# Patient Record
Sex: Male | Born: 1990 | Race: White | Hispanic: No | Marital: Married | Smoking: Former smoker
Health system: Southern US, Community
[De-identification: ages and names within clinical notes are randomized; demographics above are authoritative.]

## PROBLEM LIST (undated history)

## (undated) DIAGNOSIS — Z973 Presence of spectacles and contact lenses: Secondary | ICD-10-CM

## (undated) DIAGNOSIS — K219 Gastro-esophageal reflux disease without esophagitis: Secondary | ICD-10-CM

## (undated) DIAGNOSIS — K5792 Diverticulitis of intestine, part unspecified, without perforation or abscess without bleeding: Secondary | ICD-10-CM

## (undated) DIAGNOSIS — F988 Other specified behavioral and emotional disorders with onset usually occurring in childhood and adolescence: Secondary | ICD-10-CM

## (undated) DIAGNOSIS — I10 Essential (primary) hypertension: Secondary | ICD-10-CM

## (undated) DIAGNOSIS — J45909 Unspecified asthma, uncomplicated: Secondary | ICD-10-CM

## (undated) HISTORY — PX: ROTATOR CUFF REPAIR: SHX139

## (undated) HISTORY — DX: Other specified behavioral and emotional disorders with onset usually occurring in childhood and adolescence: F98.8

## (undated) HISTORY — PX: TONSILLECTOMY: SUR1361

## (undated) HISTORY — DX: Essential (primary) hypertension: I10

## (undated) HISTORY — DX: Presence of spectacles and contact lenses: Z97.3

## (undated) HISTORY — PX: VASECTOMY: SHX75

## (undated) HISTORY — DX: Unspecified asthma, uncomplicated: J45.909

---
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
## 2010-03-02 ENCOUNTER — Ambulatory Visit (HOSPITAL_COMMUNITY): Admission: RE | Admit: 2010-03-02 | Discharge: 2010-03-02 | Payer: Self-pay | Admitting: Pediatrics

## 2010-09-19 LAB — URINALYSIS, ROUTINE W REFLEX MICROSCOPIC
Bilirubin Urine: NEGATIVE
Glucose, UA: NEGATIVE mg/dL
Hgb urine dipstick: NEGATIVE
Nitrite: NEGATIVE
Specific Gravity, Urine: 1.01 (ref 1.005–1.030)
pH: 7 (ref 5.0–8.0)

## 2010-10-21 NOTE — Procedures (Signed)
NAMEJERICO, Brett Gonzalez             ACCOUNT NO.:  [id number]   MEDICAL RECORD NO.:  [id number]          PATIENT TYPE:  OUT   LOCATION:  RAD                           FACILITY:  [facility]   PHYSICIAN:  Edward L. Juanetta Gosling, M.D.DATE OF BIRTH:  Oct 30, 1990   DATE OF PROCEDURE:  DATE OF DISCHARGE:  06/08/2008                            PULMONARY FUNCTION TEST   Spirometry is normal and there is no evidence of airflow obstruction.      Edward L. Juanetta Gosling, M.D.  Electronically Signed     [facility]/MEDQ  D:  06/12/2008  T:  06/12/2008  Job:  [id number]   cc:   Jeoffrey Massed, MD  Fax: [phone]

## 2010-11-05 IMAGING — CR DG KNEE COMPLETE 4+V*R*
4 series · 4 of 4 positions shown · non-contrast
Comparison: None

CLINICAL DATA: Trauma, contusion right knee, question fracture

RIGHT KNEE - COMPLETE 4+ VIEW

[view not recorded (1 of 4)]
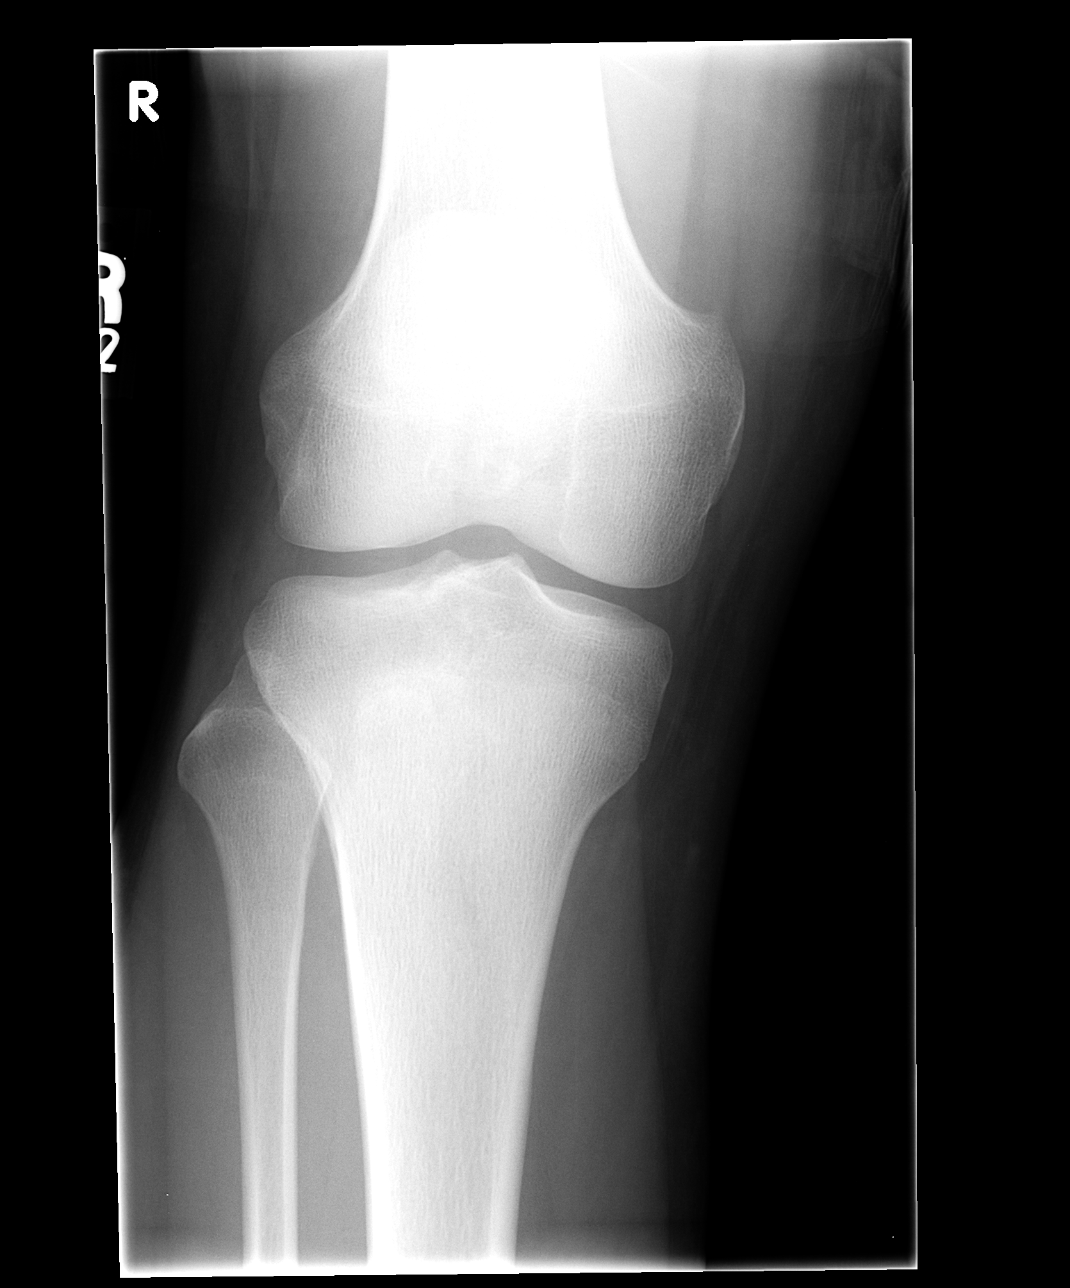

[view not recorded (2 of 4)]
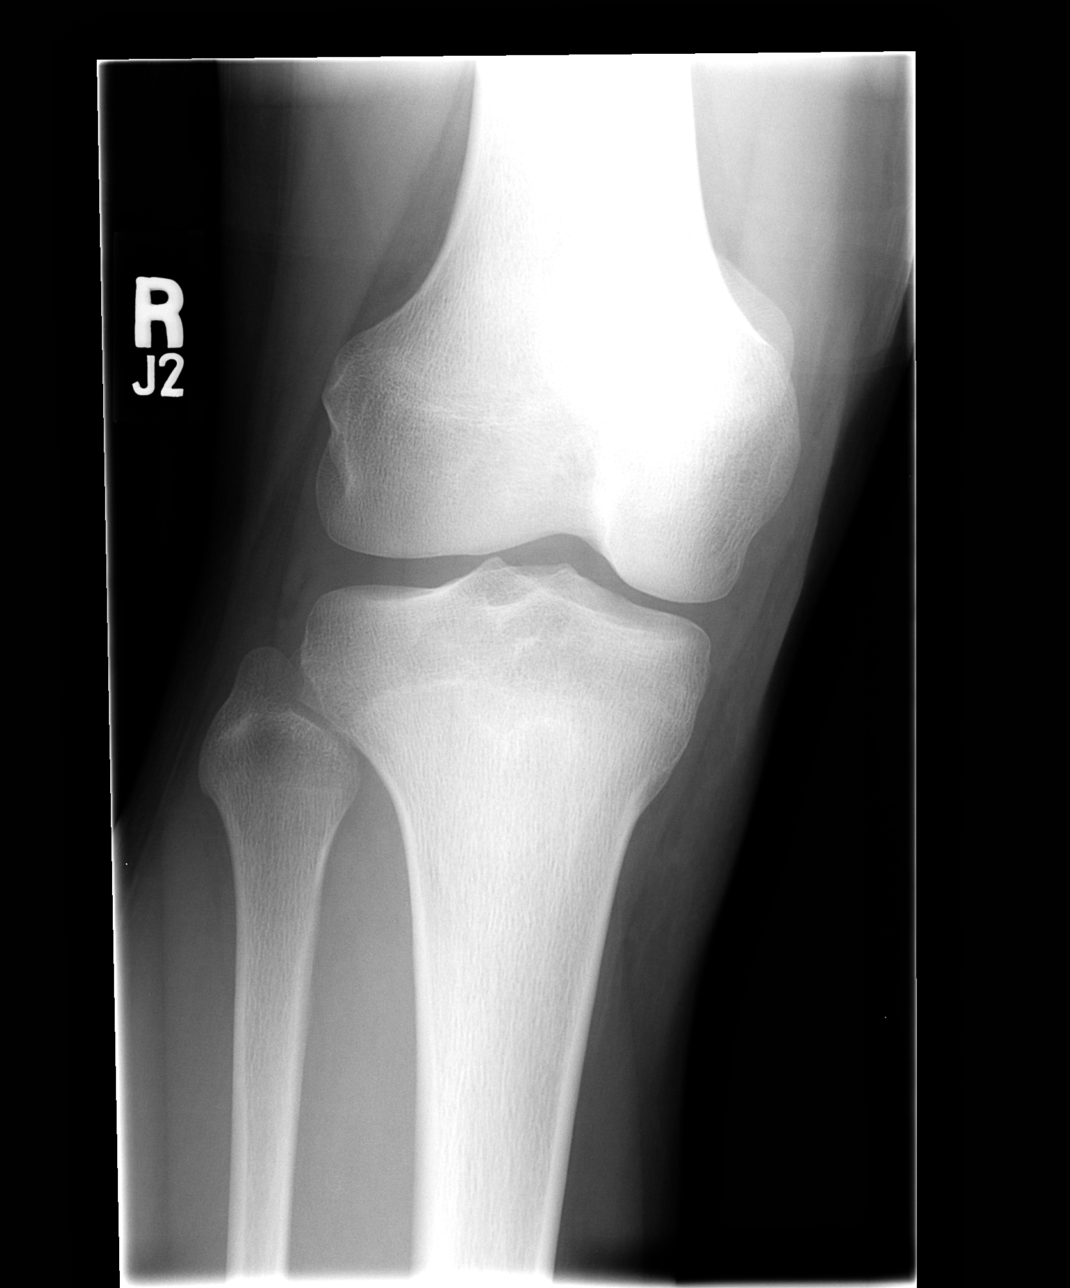

[view not recorded (3 of 4)]
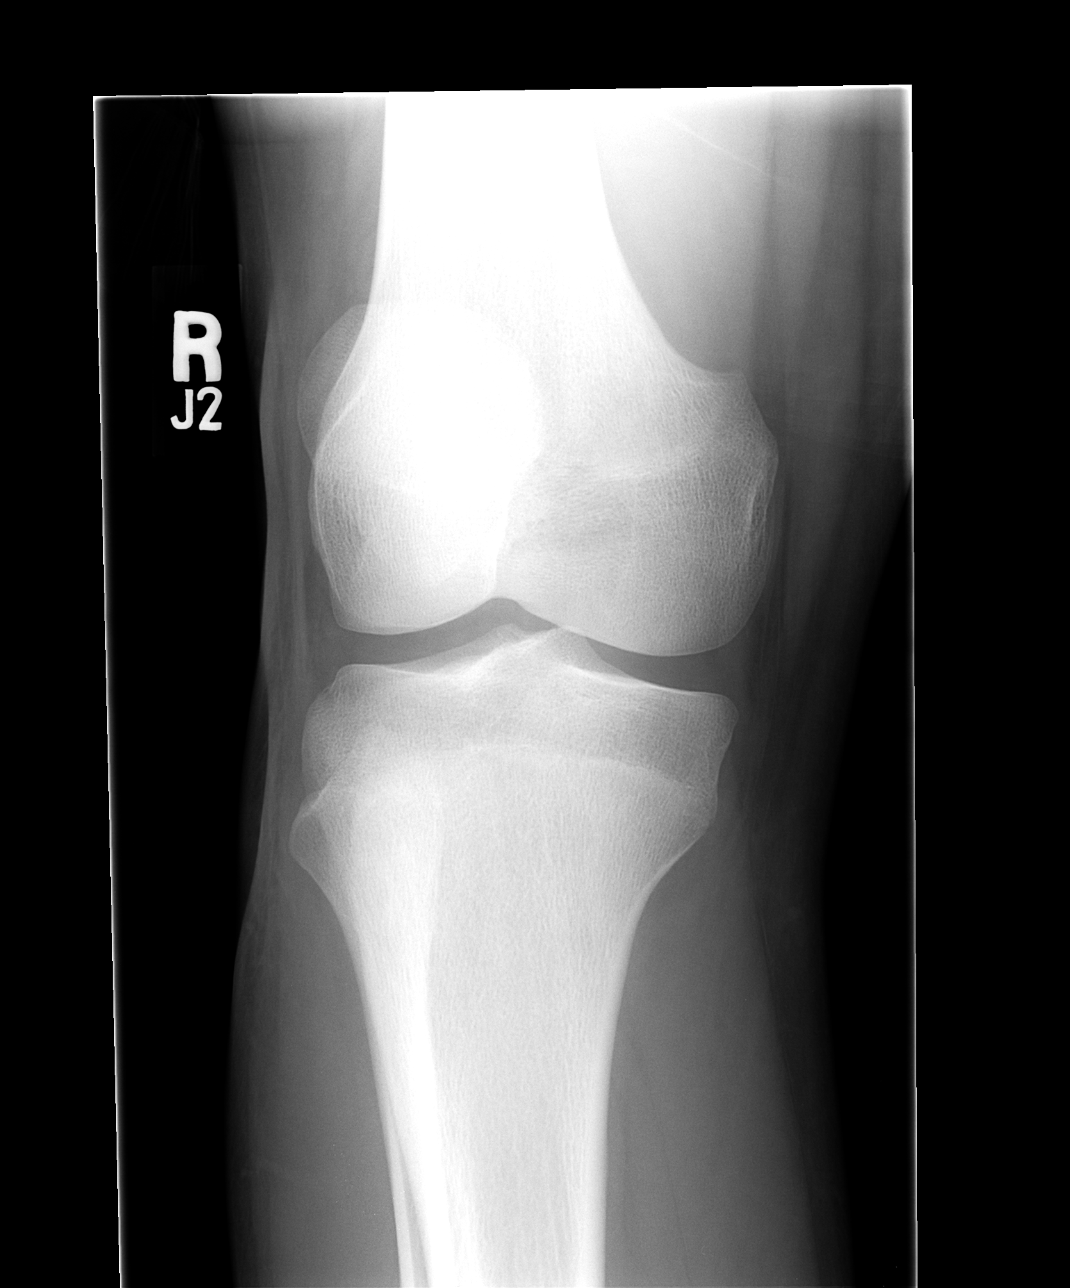

[view not recorded (4 of 4)]
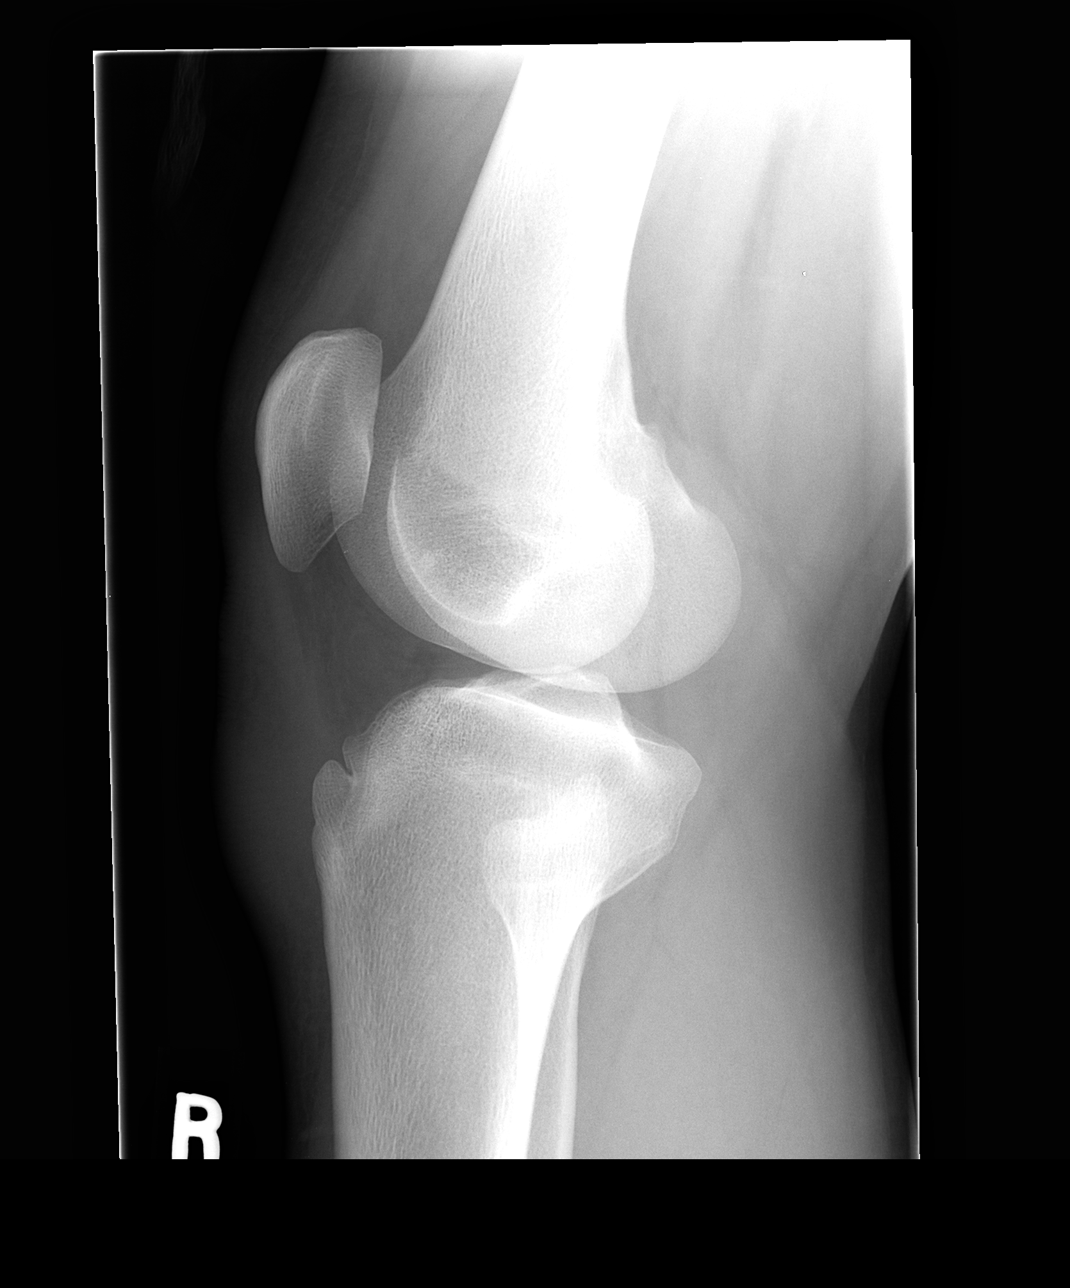

[4 of 4 positions shown; findings below may reference images not displayed]

FINDINGS: Bone mineralization normal.
Joint spaces preserved.
No fracture, dislocation, or bone destruction.
No joint effusion.
Soft tissue swelling identified anterior to the tibial tubercle and
distal patellar tendon.
IMPRESSION: Anterior soft tissue swelling.
No acute osseous abnormalities.

## 2012-01-23 ENCOUNTER — Ambulatory Visit: Payer: Self-pay | Admitting: Urology

## 2012-12-27 ENCOUNTER — Encounter (INDEPENDENT_AMBULATORY_CARE_PROVIDER_SITE_OTHER): Payer: BC Managed Care – PPO | Admitting: Urology

## 2012-12-27 DIAGNOSIS — Z302 Encounter for sterilization: Secondary | ICD-10-CM

## 2014-04-10 ENCOUNTER — Other Ambulatory Visit: Payer: Self-pay | Admitting: Orthopedic Surgery

## 2014-04-10 NOTE — Progress Notes (Signed)
I was unable to reach patient by phone.  I left  A message on voice mail.  I instructed the patient to arrive at [facility] at --  1415 , nothing to eat or drink after midnight.   I instructed the patient to not take any medications tomorrow because we do have have a list of current medications.  I asked patient to not wear any lotions, powders, cologne, jewelry, piercing, make-up or nail polish.  I asked the patient to call [phone] the am if there were any questions or problems.

## 2014-04-11 ENCOUNTER — Ambulatory Visit (HOSPITAL_COMMUNITY): Payer: BC Managed Care – PPO | Admitting: Anesthesiology

## 2014-04-11 ENCOUNTER — Ambulatory Visit (HOSPITAL_COMMUNITY)
Admission: RE | Admit: 2014-04-11 | Discharge: 2014-04-11 | Disposition: A | Discharge status: Home / Self Care | Payer: BC Managed Care – PPO | Source: Ambulatory Visit | Attending: Orthopedic Surgery | Admitting: Orthopedic Surgery

## 2014-04-11 ENCOUNTER — Encounter (HOSPITAL_COMMUNITY)
Admission: RE | Disposition: A | Discharge status: Home / Self Care | Payer: Self-pay | Source: Ambulatory Visit | Attending: Orthopedic Surgery

## 2014-04-11 ENCOUNTER — Encounter (HOSPITAL_COMMUNITY): Payer: Self-pay | Admitting: *Deleted

## 2014-04-11 DIAGNOSIS — K219 Gastro-esophageal reflux disease without esophagitis: Secondary | ICD-10-CM | POA: Insufficient documentation

## 2014-04-11 DIAGNOSIS — X58XXXA Exposure to other specified factors, initial encounter: Secondary | ICD-10-CM | POA: Diagnosis not present

## 2014-04-11 DIAGNOSIS — Z87891 Personal history of nicotine dependence: Secondary | ICD-10-CM | POA: Diagnosis not present

## 2014-04-11 DIAGNOSIS — Z885 Allergy status to narcotic agent status: Secondary | ICD-10-CM | POA: Insufficient documentation

## 2014-04-11 DIAGNOSIS — S66322A Laceration of extensor muscle, fascia and tendon of right middle finger at wrist and hand level, initial encounter: Secondary | ICD-10-CM | POA: Diagnosis not present

## 2014-04-11 HISTORY — DX: Gastro-esophageal reflux disease without esophagitis: K21.9

## 2014-04-11 HISTORY — PX: REPAIR EXTENSOR TENDON: SHX5382

## 2014-04-11 HISTORY — PX: I & D EXTREMITY: SHX5045

## 2014-04-11 SURGERY — IRRIGATION AND DEBRIDEMENT EXTREMITY
Anesthesia: General | Site: Finger | Laterality: Right

## 2014-04-11 MED ORDER — PROMETHAZINE HCL 25 MG/ML IJ SOLN: 6.2500 mg | INTRAMUSCULAR | Status: DC | PRN

## 2014-04-11 MED ORDER — CEFAZOLIN SODIUM-DEXTROSE 2-3 GM-% IV SOLR
2.0000 g | INTRAVENOUS | Status: AC
  Administered 2014-04-11: 2 g via INTRAVENOUS

## 2014-04-11 MED ORDER — MIDAZOLAM HCL 2 MG/2ML IJ SOLN
INTRAMUSCULAR | Status: DC | PRN
  Administered 2014-04-11: 2 mg via INTRAVENOUS

## 2014-04-11 MED ORDER — CHLORHEXIDINE GLUCONATE 4 % EX LIQD
60.0000 mL | Freq: Once | CUTANEOUS | Status: DC
  Filled 2014-04-11: qty 60

## 2014-04-11 MED ORDER — MIDAZOLAM HCL 2 MG/2ML IJ SOLN
INTRAMUSCULAR | Status: AC
  Filled 2014-04-11: qty 2

## 2014-04-11 MED ORDER — LACTATED RINGERS IV SOLN
INTRAVENOUS | Status: DC | PRN
  Administered 2014-04-11 (×2): via INTRAVENOUS

## 2014-04-11 MED ORDER — FENTANYL CITRATE 0.05 MG/ML IJ SOLN
INTRAMUSCULAR | Status: AC
  Filled 2014-04-11: qty 5

## 2014-04-11 MED ORDER — BUPIVACAINE HCL (PF) 0.25 % IJ SOLN
INTRAMUSCULAR | Status: AC
  Filled 2014-04-11: qty 30

## 2014-04-11 MED ORDER — PROPOFOL 10 MG/ML IV BOLUS
INTRAVENOUS | Status: DC | PRN
  Administered 2014-04-11: 200 mg via INTRAVENOUS

## 2014-04-11 MED ORDER — 0.9 % SODIUM CHLORIDE (POUR BTL) OPTIME
TOPICAL | Status: DC | PRN
  Administered 2014-04-11: 1000 mL

## 2014-04-11 MED ORDER — BUPIVACAINE HCL (PF) 0.25 % IJ SOLN
INTRAMUSCULAR | Status: DC | PRN
  Administered 2014-04-11: 10 mL

## 2014-04-11 MED ORDER — SODIUM CHLORIDE 0.45 % IV SOLN: INTRAVENOUS | Status: DC

## 2014-04-11 MED ORDER — LIDOCAINE HCL (CARDIAC) 20 MG/ML IV SOLN
INTRAVENOUS | Status: DC | PRN
  Administered 2014-04-11: 100 mg via INTRAVENOUS

## 2014-04-11 MED ORDER — ONDANSETRON HCL 4 MG/2ML IJ SOLN
INTRAMUSCULAR | Status: DC | PRN
  Administered 2014-04-11: 4 mg via INTRAVENOUS

## 2014-04-11 MED ORDER — HYDROMORPHONE HCL 1 MG/ML IJ SOLN: 0.2500 mg | INTRAMUSCULAR | Status: DC | PRN

## 2014-04-11 MED ORDER — SODIUM CHLORIDE 0.9 % IR SOLN
Status: DC | PRN
  Administered 2014-04-11: 3000 mL

## 2014-04-11 MED ORDER — ONDANSETRON HCL 4 MG/2ML IJ SOLN
INTRAMUSCULAR | Status: AC
  Filled 2014-04-11: qty 2

## 2014-04-11 MED ORDER — SCOPOLAMINE 1 MG/3DAYS TD PT72
MEDICATED_PATCH | TRANSDERMAL | Status: AC
  Administered 2014-04-11: 1 via TRANSDERMAL
  Filled 2014-04-11: qty 1

## 2014-04-11 MED ORDER — CEFAZOLIN SODIUM-DEXTROSE 2-3 GM-% IV SOLR
INTRAVENOUS | Status: AC
  Filled 2014-04-11: qty 50

## 2014-04-11 MED ORDER — DEXAMETHASONE SODIUM PHOSPHATE 4 MG/ML IJ SOLN
INTRAMUSCULAR | Status: DC | PRN
  Administered 2014-04-11: 4 mg via INTRAVENOUS

## 2014-04-11 MED ORDER — FENTANYL CITRATE 0.05 MG/ML IJ SOLN
INTRAMUSCULAR | Status: DC | PRN
  Administered 2014-04-11 (×6): 50 ug via INTRAVENOUS

## 2014-04-11 SURGICAL SUPPLY — 63 items
BANDAGE ELASTIC 3 VELCRO ST LF (GAUZE/BANDAGES/DRESSINGS) ×2 IMPLANT
BANDAGE ELASTIC 4 VELCRO ST LF (GAUZE/BANDAGES/DRESSINGS) ×3 IMPLANT
BNDG COHESIVE 1X5 TAN STRL LF (GAUZE/BANDAGES/DRESSINGS) IMPLANT
BNDG CONFORM 2 STRL LF (GAUZE/BANDAGES/DRESSINGS) IMPLANT
BNDG GAUZE ELAST 4 BULKY (GAUZE/BANDAGES/DRESSINGS) ×5 IMPLANT
CORDS BIPOLAR (ELECTRODE) ×3 IMPLANT
COVER SURGICAL LIGHT HANDLE (MISCELLANEOUS) ×3 IMPLANT
CUFF TOURNIQUET SINGLE 18IN (TOURNIQUET CUFF) ×3 IMPLANT
CUFF TOURNIQUET SINGLE 24IN (TOURNIQUET CUFF) IMPLANT
DECANTER SPIKE VIAL GLASS SM (MISCELLANEOUS) ×1 IMPLANT
DRAPE SURG 17X23 STRL (DRAPES) ×3 IMPLANT
DRSG ADAPTIC 3X8 NADH LF (GAUZE/BANDAGES/DRESSINGS) ×1 IMPLANT
DRSG EMULSION OIL 3X3 NADH (GAUZE/BANDAGES/DRESSINGS) ×2 IMPLANT
ELECT REM PT RETURN 9FT ADLT (ELECTROSURGICAL)
ELECTRODE REM PT RTRN 9FT ADLT (ELECTROSURGICAL) IMPLANT
GAUZE SPONGE 2X2 8PLY STRL LF (GAUZE/BANDAGES/DRESSINGS) IMPLANT
GAUZE SPONGE 4X4 12PLY STRL (GAUZE/BANDAGES/DRESSINGS) ×3 IMPLANT
GAUZE XEROFORM 1X8 LF (GAUZE/BANDAGES/DRESSINGS) ×3 IMPLANT
GLOVE BIOGEL M STRL SZ7.5 (GLOVE) ×3 IMPLANT
GLOVE SS BIOGEL STRL SZ 8 (GLOVE) ×1 IMPLANT
GLOVE SUPERSENSE BIOGEL SZ 8 (GLOVE) ×2
GOWN STRL REUS W/ TWL LRG LVL3 (GOWN DISPOSABLE) ×3 IMPLANT
GOWN STRL REUS W/ TWL XL LVL3 (GOWN DISPOSABLE) ×3 IMPLANT
GOWN STRL REUS W/TWL LRG LVL3 (GOWN DISPOSABLE) ×3
GOWN STRL REUS W/TWL XL LVL3 (GOWN DISPOSABLE) ×6
HANDPIECE INTERPULSE COAX TIP (DISPOSABLE)
KIT BASIN OR (CUSTOM PROCEDURE TRAY) ×3 IMPLANT
KIT ROOM TURNOVER OR (KITS) ×3 IMPLANT
MANIFOLD NEPTUNE II (INSTRUMENTS) ×3 IMPLANT
NDL HYPO 25GX1X1/2 BEV (NEEDLE) IMPLANT
NEEDLE HYPO 25GX1X1/2 BEV (NEEDLE) ×3 IMPLANT
NS IRRIG 1000ML POUR BTL (IV SOLUTION) ×3 IMPLANT
PACK ORTHO EXTREMITY (CUSTOM PROCEDURE TRAY) ×3 IMPLANT
PAD ARMBOARD 7.5X6 YLW CONV (MISCELLANEOUS) ×6 IMPLANT
PAD CAST 4YDX4 CTTN HI CHSV (CAST SUPPLIES) ×1 IMPLANT
PADDING CAST COTTON 4X4 STRL (CAST SUPPLIES) ×3
PADDING CAST SYNTHETIC 2 (CAST SUPPLIES) ×4
PADDING CAST SYNTHETIC 2X4 NS (CAST SUPPLIES) IMPLANT
SET HNDPC FAN SPRY TIP SCT (DISPOSABLE) IMPLANT
SOLUTION BETADINE 4OZ (MISCELLANEOUS) ×3 IMPLANT
SPECIMEN JAR SMALL (MISCELLANEOUS) ×3 IMPLANT
SPONGE GAUZE 2X2 STER 10/PKG (GAUZE/BANDAGES/DRESSINGS)
SPONGE LAP 18X18 X RAY DECT (DISPOSABLE) ×1 IMPLANT
SPONGE LAP 4X18 X RAY DECT (DISPOSABLE) ×1 IMPLANT
SPONGE SCRUB IODOPHOR (GAUZE/BANDAGES/DRESSINGS) ×3 IMPLANT
SUCTION FRAZIER TIP 10 FR DISP (SUCTIONS) ×2 IMPLANT
SUT FIBERWIRE 3-0 18 DIAM 3/8 (SUTURE) ×3
SUT FIBERWIRE 4-0 18 TAPR NDL (SUTURE) ×3
SUT MERSILENE 4 0 P 3 (SUTURE) IMPLANT
SUT PROLENE 4 0 PS 2 18 (SUTURE) ×4 IMPLANT
SUT VIC AB 2-0 CT1 27 (SUTURE)
SUT VIC AB 2-0 CT1 TAPERPNT 27 (SUTURE) IMPLANT
SUTURE FIBERWR 3-0 18 DIAM 3/8 (SUTURE) IMPLANT
SUTURE FIBERWR 4-0 18 TAPR NDL (SUTURE) IMPLANT
SYR CONTROL 10ML LL (SYRINGE) ×2 IMPLANT
TOWEL OR 17X24 6PK STRL BLUE (TOWEL DISPOSABLE) ×3 IMPLANT
TOWEL OR 17X26 10 PK STRL BLUE (TOWEL DISPOSABLE) ×3 IMPLANT
TUBE ANAEROBIC SPECIMEN COL (MISCELLANEOUS) IMPLANT
TUBE CONNECTING 12'X1/4 (SUCTIONS) ×1
TUBE CONNECTING 12X1/4 (SUCTIONS) ×2 IMPLANT
UNDERPAD 30X30 INCONTINENT (UNDERPADS AND DIAPERS) ×3 IMPLANT
WATER STERILE IRR 1000ML POUR (IV SOLUTION) ×3 IMPLANT
YANKAUER SUCT BULB TIP NO VENT (SUCTIONS) ×3 IMPLANT

## 2014-04-11 NOTE — Op Note (Signed)
See Dictation #[id number] Amanda PeaGramig MD

## 2014-04-11 NOTE — Anesthesia Postprocedure Evaluation (Signed)
  Anesthesia Post-op Note  Patient: Brett AntonMatthew T Gonzalez  Procedure(s) Performed: Procedure(s): IRRIGATION AND DEBRIDEMENT RIGHT HAND AND MIDDLE FINGER WITH  (Right) EXTENSOR TENDON REPAIR AND RECONSTRUCTION  (Right)  Patient Location: PACU  Anesthesia Type:General  Level of Consciousness: awake and alert   Airway and Oxygen Therapy: Patient Spontanous Breathing  Post-op Pain: mild  Post-op Assessment: Post-op Vital signs reviewed  Post-op Vital Signs: stable  Last Vitals:  Filed Vitals:   04/11/14 1625  BP: 119/67  Pulse: 77  Temp:   Resp: 12    Complications: No apparent anesthesia complications

## 2014-04-11 NOTE — Transfer of Care (Signed)
Immediate Anesthesia Transfer of Care Note  Patient: Brett AntonMatthew T Glauber  Procedure(s) Performed: Procedure(s): IRRIGATION AND DEBRIDEMENT RIGHT HAND AND MIDDLE FINGER WITH  (Right) EXTENSOR TENDON REPAIR AND RECONSTRUCTION  (Right)  Patient Location: PACU  Anesthesia Type:General  Level of Consciousness: sedated  Airway & Oxygen Therapy: Patient Spontanous Breathing and Patient connected to nasal cannula oxygen  Post-op Assessment: Report given to PACU RN and Post -op Vital signs reviewed and stable  Post vital signs: Reviewed and stable  Complications: No apparent anesthesia complications

## 2014-04-11 NOTE — Anesthesia Preprocedure Evaluation (Addendum)
Anesthesia Evaluation  Patient identified by MRN, date of birth, ID band Patient awake    Reviewed: Allergy & Precautions, H&P , NPO status , Patient's Chart, lab work & pertinent test results  Airway Mallampati: III  TM Distance: >3 FB Neck ROM: Full    Dental  (+) Teeth Intact, Dental Advisory Given   Pulmonary neg pulmonary ROS, former smoker,  breath sounds clear to auscultation        Cardiovascular negative cardio ROS  Rhythm:Regular Rate:Normal     Neuro/Psych    GI/Hepatic GERD-  Medicated and Controlled,  Endo/Other  negative endocrine ROS  Renal/GU negative Renal ROS     Musculoskeletal negative musculoskeletal ROS (+)   Abdominal   Peds  Hematology negative hematology ROS (+)   Anesthesia Other Findings   Reproductive/Obstetrics                            Anesthesia Physical Anesthesia Plan  ASA: I  Anesthesia Plan: General   Post-op Pain Management:    Induction: Intravenous  Airway Management Planned:   Additional Equipment:   Intra-op Plan:   Post-operative Plan: Extubation in OR  Informed Consent: I have reviewed the patients History and Physical, chart, labs and discussed the procedure including the risks, benefits and alternatives for the proposed anesthesia with the patient or authorized representative who has indicated his/her understanding and acceptance.   Dental advisory given  Plan Discussed with: CRNA and Surgeon  Anesthesia Plan Comments:         Anesthesia Quick Evaluation

## 2014-04-11 NOTE — Op Note (Signed)
NAMAnise Gonzalez:  Hayman, Supreme             ACCOUNT NO.:  [id number]  MEDICAL RECORD NO.:  [id number]  LOCATION:  [facility]                         FACILITY:  [facility]  PHYSICIAN:  Dionne AnoWilliam M. Ellaina Schuler, M.D.DATE OF BIRTH:  02-11-1991  DATE OF PROCEDURE:  04/11/2014 DATE OF DISCHARGE:  04/11/2014                              OPERATIVE REPORT   PREOPERATIVE DIAGNOSIS:  Right middle finger laceration at the hand region with loss of EDC (extensor digitorum communis) function.  POSTOPERATIVE DIAGNOSIS:  Right middle finger laceration at the hand region with loss of EDC (extensor digitorum communis) function.  PROCEDURE: 1. I and D of skin, subcutaneous tissue, muscle, and associated soft     tissues.  This was an excisional debridement. 2. Repair of extensor digitorum communis to the middle finger at the     hand level.  This was mid metacarpal repair to distal metacarpal     region.  SURGEON:  Dionne AnoWilliam M. Amanda PeaGramig, M.D.  ASSISTANT:  Karie ChimeraBrian Buchanan, PA-C.  COMPLICATION:  None.  ANESTHESIA:  General.  TOURNIQUET TIME:  Less than 30 minutes.  INDICATIONS:  The patient is a pleasant male who presents with the above- mentioned diagnosis.  I have counseled him in regard to risks and benefits of surgery, and he desires to proceed with the above-mentioned operative intervention.  He has lost function in his middle finger due to loss of the extensor apparatus.  He desires to proceed with surgical avenues of care.  OPERATION IN DETAIL:  The patient was seen by myself and Anesthesia, taken to the operative suite, underwent smooth induction of general anesthesia, prepped and draped in usual sterile fashion.  Preoperative antibiotics were given.  Arm was elevated, tourniquet was insufflated to 250 mmHg, and time-out had previously been called I should note.  Following this, I and D of skin, subcutaneous tissue, muscle, was accomplished.  3 L of saline were placed through and through the wound after exposure  of the tendon and soft tissues.  There was no gross contamination, infection, or other problems.  At this time, we then prepared the tendon for repair. The patient had an 8-strand modified Mariane BaumgartenKessler Tajima repair with 3-0 and 4-0 FiberWire without difficulty. The tendon approximated beautifully without difficulty.  Following this, the patient then underwent irrigation, followed by wound closure.  He demonstrated an excellent tenodesis effect, and there was no tension at the repair with 75% flexion.  I have discussed with the patient these issues.  Following dressing and application, he was placed in a fiberglass cast, he will see us in 12 days.  We will mobilize him for 4 weeks and begin gentle interval range of motion, massage, and therapeutic algorithm. These notes have been discussed.  All questions have been encouraged and answered.     Dionne AnoWilliam M. Amanda PeaGramig, M.D.     [facility]  D:  04/11/2014  T:  04/11/2014  Job:  [id number]

## 2014-04-11 NOTE — Anesthesia Procedure Notes (Signed)
Procedure Name: LMA Insertion Date/Time: 04/11/2014 3:04 PM Performed by: Alanda AmassFRIEDMAN, Quandra Fedorchak A Pre-anesthesia Checklist: Patient identified, Timeout performed, Emergency Drugs available, Suction available and Patient being monitored Patient Re-evaluated:Patient Re-evaluated prior to inductionOxygen Delivery Method: Circle system utilized Preoxygenation: Pre-oxygenation with 100% oxygen Intubation Type: IV induction LMA: LMA inserted LMA Size: 5.0 Number of attempts: 1 Placement Confirmation: positive ETCO2 and breath sounds checked- equal and bilateral Tube secured with: Tape Dental Injury: Teeth and Oropharynx as per pre-operative assessment

## 2014-04-11 NOTE — H&P (Signed)
Brett Gonzalez is an 23 y.o. male.   Chief Complaint: right hand laceration with loss of extension HPI: patient presents for repair reconstruction right hand  Past Medical History  Diagnosis Date  . GERD (gastroesophageal reflux disease)   . Medical history non-contributory     Past Surgical History  Procedure Laterality Date  . Rotator cuff repair Right     History reviewed. No pertinent family history. Social History:  reports that he quit smoking about 3 months ago. He does not have any smokeless tobacco history on file. He reports that he does not drink alcohol or use illicit drugs.  Allergies:  Allergies  Allergen Reactions  . Codeine Swelling    Medications Prior to Admission  Medication Sig Dispense Refill  . omeprazole (PRILOSEC OTC) 20 MG tablet Take 20 mg by mouth daily.      No results found for this or any previous visit (from the past 48 hour(s)). No results found.  Review of Systems  Respiratory: Negative.   Cardiovascular: Negative.   Gastrointestinal: Negative.   Genitourinary: Negative.   Psychiatric/Behavioral: Negative.     Blood pressure 141/70, pulse 77, temperature 98 F (36.7 C), temperature source Oral, resp. rate 16, height 5\' 10"  (1.778 m), weight 92.987 kg (205 lb), SpO2 99 %. Physical Exam  Laceration right hand with loss of extension Patient presents for the repairs necessary. The patient is alert and oriented in no acute distress the patient complains of pain in the affected upper extremity.  The patient is noted to have a normal HEENT exam.  Lung fields show equal chest expansion and no shortness of breath  abdomen exam is nontender without distention.  Lower extremity examination does not show any fracture dislocation or blood clot symptoms.  Pelvis is stable neck and back are stable and nontender        Assessment/Plan Plan irrigation debridement and repair right hand extensor apparatus  We are planning surgery for your  upper extremity. The risk and benefits of surgery include risk of bleeding infection anesthesia damage to normal structures and failure of the surgery to accomplish its intended goals of relieving symptoms and restoring function with this in mind we'll going to proceed. I have specifically discussed with the patient the pre-and postoperative regime and the does and don'ts and risk and benefits in great detail. Risk and benefits of surgery also include risk of dystrophy chronic nerve pain failure of the healing process to go onto completion and other inherent risks of surgery The relavent the pathophysiology of the disease/injury process, as well as the alternatives for treatment and postoperative course of action has been discussed in great detail with the patient who desires to proceed.  We will do everything in our power to help you (the patient) restore function to the upper extremity. Is a pleasure to see this patient today.   Karen ChafeGRAMIG III,Jovaun Levene M 04/11/2014, 3:53 PM

## 2014-04-14 ENCOUNTER — Encounter (HOSPITAL_COMMUNITY): Payer: Self-pay | Admitting: Orthopedic Surgery

## 2016-06-28 ENCOUNTER — Ambulatory Visit (INDEPENDENT_AMBULATORY_CARE_PROVIDER_SITE_OTHER): Payer: Managed Care, Other (non HMO) | Admitting: Medical

## 2016-06-28 ENCOUNTER — Encounter: Payer: Self-pay | Admitting: Medical

## 2016-06-28 VITALS — BP 152/88 | HR 95 | Temp 99.0°F | Ht 69.0 in | Wt 237.6 lb

## 2016-06-28 DIAGNOSIS — K529 Noninfective gastroenteritis and colitis, unspecified: Secondary | ICD-10-CM

## 2016-06-28 DIAGNOSIS — R009 Unspecified abnormalities of heart beat: Secondary | ICD-10-CM | POA: Diagnosis not present

## 2016-06-28 DIAGNOSIS — R195 Other fecal abnormalities: Secondary | ICD-10-CM

## 2016-06-28 DIAGNOSIS — I10 Essential (primary) hypertension: Secondary | ICD-10-CM

## 2016-06-28 NOTE — Progress Notes (Signed)
Subjective: Chief Complaint  Patient presents with  . new patient    stomach cramping and  pain, vomitting,  acid reflux    Here as a new patient. Accompanied by wife.   Has been having diarrhea.  2 weeks ago got sick with nausea, vomiting, and diarrhea.   Lasted 2.5 days.  This resolved.  2 days ago had similar symptoms again, had irritable bowels, had several episodes of diarrhea.  13 loose stools yesterday, 2-3 loose stools today.  13 loose stools 2 days ago.   No nausea, no vomiting, no fever, no blood in stool.  Last night had more cramping in stomach, bloating, cramping.  His children had symptoms just a few days Raquel Sarna and [place]).    They were both sick last week.  Wife had similar symptoms last week.  Had some dizziness, some fatigue.   His parents had the stomach bug in their household as well recently.      No recent travel, no new animal exposure.  Have a cat at home.   Cooks food thorough, washes produce.   No recent antibiotics.  No recent visitation to hospital or nursing home.   Mom has IBS.  He has hx/o GERD.  GERD aggravated by spicy foods, [place].   Eats a lot of spicy foods.  Works in Therapist, occupational.  Stress - normal   Had physical urgent care in December, pre-employment  No labs done.   Has had elevated BPs in the past.  Past Medical History:  Diagnosis Date  . ADD (attention deficit disorder)   . Asthma    worse in childhood  . GERD (gastroesophageal reflux disease)   . Wears contact lenses    Current Outpatient Prescriptions on File Prior to Visit  Medication Sig Dispense Refill  . omeprazole (PRILOSEC OTC) 20 MG tablet Take 20 mg by mouth daily.     No current facility-administered medications on file prior to visit.     ROS as in subjective  Objective: BP (!) 152/88 (BP Location: Right Arm, Patient Position: Sitting)   Pulse 95   Temp 99 F (37.2 C) (Oral)   Ht '5\' 9"'  (1.753 m)   Wt 237 lb 9.6 oz (107.8 kg)   SpO2 97%   BMI 35.09 kg/m   General  appearance: alert, no distress, WD/WN HEENT: normocephalic, sclerae anicteric, PERRLA, EOMi, nares patent, no discharge or erythema, pharynx normal Oral cavity: MMM, no lesions Neck: supple, no lymphadenopathy, no thyromegaly, no masses Heart: RRR, normal S1, S2, no murmurs Lungs: CTA bilaterally, no wheezes, rhonchi, or rales Abdomen: +bs, soft, mild lower abdominal tenderness, non distended, no masses, no hepatomegaly, no splenomegaly Extremities: no edema, no cyanosis, no clubbing Pulses: 2+ symmetric, upper and lower extremities, normal cap refill Neurological: alert, oriented x 3, CN2-12 intact, strength normal upper extremities and lower extremities, sensation normal throughout, DTRs 2+ throughout, no cerebellar signs, gait normal Psychiatric: normal affect, behavior normal, pleasant     Assessment: Encounter Diagnoses  Name Primary?  . Gastroenteritis Yes  . Loose stools   . Elevated heart rate with elevated blood pressure and diagnosis of hypertension      Plan: Discussed symptoms.  Etiology most likely viral gastroenteritis.  discussed hydration, hygiene, supportive care.  If not resolving in 2-3 days, or if worse, then plan on returning for stool kit for stool testing. Can use some pepto bismol  Elevated BP - return for physical, fasting labs soon.  Discussed significant of uncontrolled hypertension.  discussed need  to lose weight, exercise regularly, eat a healthy diet.    Makayla was seen today for new patient.  Diagnoses and all orders for this visit:  Gastroenteritis  Loose stools  Elevated heart rate with elevated blood pressure and diagnosis of hypertension

## 2016-07-17 ENCOUNTER — Other Ambulatory Visit: Payer: Self-pay | Admitting: Medical

## 2016-07-17 ENCOUNTER — Telehealth: Payer: Self-pay | Admitting: Family Medicine

## 2016-07-17 MED ORDER — OSELTAMIVIR PHOSPHATE 75 MG PO CAPS: 75.0000 mg | ORAL_CAPSULE | Freq: Two times a day (BID) | ORAL | 0 refills | Status: DC

## 2016-07-17 NOTE — Telephone Encounter (Signed)
Pt called and states both kids had flu last week and he is now experiencing fever 102, weakness, same as they did.  Will you call him in Tamiflu to [facility] [place].  Pt ph [phone]

## 2016-07-17 NOTE — Telephone Encounter (Signed)
I sent Brett Gonzalez assuming he has body aches, chills, fever, etc., however, this doesn't replace evaluation.  This is mainly due to his exposure.     We can't rule out pneumonia or other without devaluation and exam, so you can recommend office visit.   Certainly recommend rest, hydration and fever control with tylenol or ibuprofen for example.

## 2016-07-17 NOTE — Telephone Encounter (Signed)
Pt called he is experiencing the aches and pains.  He is resting and hydrating.  He will let us know if he does not get better.

## 2016-07-21 ENCOUNTER — Telehealth: Payer: Self-pay

## 2016-07-21 NOTE — Telephone Encounter (Signed)
[facility]'s records [facility] is in EPIC, under Epic conversion. Please review. Trixie Rude/RLB

## 2016-09-27 ENCOUNTER — Telehealth: Payer: Self-pay

## 2016-09-27 ENCOUNTER — Encounter: Payer: Managed Care, Other (non HMO) | Admitting: Medical

## 2016-09-27 NOTE — Telephone Encounter (Signed)

## 2016-09-27 NOTE — Telephone Encounter (Signed)
D

## 2016-09-28 NOTE — Telephone Encounter (Signed)
Forwarding to kathy  

## 2016-10-06 ENCOUNTER — Encounter: Payer: Self-pay | Admitting: Medical

## 2016-10-06 NOTE — Telephone Encounter (Signed)
No show letter with request to reschedule sent 10/06/2016.

## 2016-12-21 ENCOUNTER — Ambulatory Visit (INDEPENDENT_AMBULATORY_CARE_PROVIDER_SITE_OTHER): Payer: Managed Care, Other (non HMO) | Admitting: Family Medicine

## 2016-12-21 ENCOUNTER — Encounter: Payer: Self-pay | Admitting: Family Medicine

## 2016-12-21 VITALS — BP 130/86 | HR 102 | Wt 242.0 lb

## 2016-12-21 DIAGNOSIS — F321 Major depressive disorder, single episode, moderate: Secondary | ICD-10-CM | POA: Diagnosis not present

## 2016-12-21 MED ORDER — CITALOPRAM HYDROBROMIDE 20 MG PO TABS: 20.0000 mg | ORAL_TABLET | Freq: Every day | ORAL | 3 refills | Status: DC

## 2016-12-21 NOTE — Progress Notes (Signed)
   Subjective:    Patient ID: Brett Gonzalez, male    DOB: 02/16/1991, 26 y.o.   MRN: [id number]  HPI He is here for consult concerning depression. He states that it started shortly after his divorce in 2014. He also had to deal with 2 children in custody. He did at that time feel as if he was a failure was able to work through that. He then went to another breakup in 2016. This did create some difficulty with thrush issues. He does get his children every weekend and seems be doing well with this. He does admit to worsening symptoms of fatigue, being withdrawn, crying, sleep disturbance and feeling as if he is a failure.   Review of Systems     Objective:   Physical Exam Alert and in no distress with appropriate affect and addressed appropriately.       Assessment & Plan:  Depression, major, single episode, moderate (HCC) - Plan: citalopram (CELEXA) 20 MG tablet I explained that all the symptoms that he has have and are entirely normal and the circumstances. I will start him on Celexa. He will also check with his insurance to see referral to a psychologist. Explained the fact that we need to work on both a medication point of view as well as counseling. He did understand and is willing to do this. Follow-up here in one month.

## 2017-02-09 ENCOUNTER — Ambulatory Visit (INDEPENDENT_AMBULATORY_CARE_PROVIDER_SITE_OTHER): Payer: Managed Care, Other (non HMO) | Admitting: Family Medicine

## 2017-02-09 ENCOUNTER — Encounter: Payer: Self-pay | Admitting: Family Medicine

## 2017-02-09 VITALS — BP 140/90 | HR 86 | Resp 18 | Wt 244.2 lb

## 2017-02-09 DIAGNOSIS — F321 Major depressive disorder, single episode, moderate: Secondary | ICD-10-CM

## 2017-02-09 DIAGNOSIS — Z23 Encounter for immunization: Secondary | ICD-10-CM | POA: Diagnosis not present

## 2017-02-09 MED ORDER — CITALOPRAM HYDROBROMIDE 20 MG PO TABS: 20.0000 mg | ORAL_TABLET | Freq: Every day | ORAL | 1 refills | Status: DC

## 2017-02-09 NOTE — Progress Notes (Signed)
   Subjective:    Patient ID: Brett Gonzalez, male    DOB: 06/16/1990, 26 y.o.   MRN: [id number]  HPI He is here for recheck. He states that he is essentially back to normal having much more positive attitude towards everything. He is open up to his girlfriend friends and parents and they have all been very supportive. He has not gotten involved in counseling and at this point doesn't feel the need.   Review of Systems     Objective:   Physical Exam [place] and in no distress with appropriate affect       Assessment & Plan:  Depression, major, single episode, moderate (HCC) - Plan: citalopram (CELEXA) 20 MG tablet  Need for influenza vaccination - Plan: Flu Vaccine QUAD 6+ mos PF IM (Fluarix Quad PF)  He feels as if he is now back to normal. I will keep him on his present medication regimen and recheck that in 6.

## 2017-05-01 ENCOUNTER — Telehealth: Payer: Self-pay

## 2017-05-01 NOTE — Telephone Encounter (Signed)
Rcvd note from [employer] stating pt may have potential gaps in medication compliance with Celexa.   Attempted call to pt- # in chart disconnected. Trixie Rude/RLB

## 2017-05-09 ENCOUNTER — Telehealth: Payer: Self-pay | Admitting: Family Medicine

## 2017-05-09 DIAGNOSIS — F321 Major depressive disorder, single episode, moderate: Secondary | ICD-10-CM

## 2017-05-09 MED ORDER — CITALOPRAM HYDROBROMIDE 20 MG PO TABS: 20.0000 mg | ORAL_TABLET | Freq: Every day | ORAL | 1 refills | Status: DC

## 2017-05-09 NOTE — Telephone Encounter (Signed)
Recv'd refill request from [facility] for Citalopram 20mg  #30

## 2017-08-07 ENCOUNTER — Ambulatory Visit: Payer: Managed Care, Other (non HMO) | Admitting: Family Medicine

## 2017-08-07 ENCOUNTER — Encounter: Payer: Self-pay | Admitting: Family Medicine

## 2017-08-07 VITALS — BP 134/82 | HR 92 | Temp 98.9°F | Resp 16 | Wt 252.6 lb

## 2017-08-07 DIAGNOSIS — J029 Acute pharyngitis, unspecified: Secondary | ICD-10-CM | POA: Diagnosis not present

## 2017-08-07 LAB — POCT RAPID STREP A (OFFICE): Rapid Strep A Screen: NEGATIVE

## 2017-08-07 NOTE — Progress Notes (Signed)
Chief Complaint  Patient presents with  . swollen throat , feeling tried , ear pain    started on sunday ,     Subjective:  Brett Gonzalez is a 27 y.o. male who presents for a 3 day history of fatigue, sore throat, facial pain, right ear clogged, rhinorrhea, post nasal drainage and mild nasal congestion.  Reports history of right ear infections.  No recent antibiotics.  Denies fever, chills, body aches, chest pain, shortness of breath, abdominal pain, N/VD   Treatment to date: Mucinex and ibuprofen.  Denies sick contacts.  No other aggravating or relieving factors.  No other c/o.  ROS as in subjective.   Objective: Vitals:   08/07/17 1535  BP: 134/82  Pulse: 92  Resp: 16  Temp: 98.9 F (37.2 C)  SpO2: 97%    General appearance: Alert, WD/WN, no distress, mildly ill appearing                             Skin: warm, no rash                           Head: mild frontal sinus tenderness                            Eyes: conjunctiva normal, corneas clear, PERRLA                            Ears: pearly TMs, fluid behind right TM, external ear canals normal                          Nose: septum midline, turbinates swollen, with erythema and clear discharge             Mouth/throat: MMM, tongue normal, mild pharyngeal erythema, no edema or exudate                           Neck: supple, no adenopathy, no thyromegaly, nontender                          Heart: RRR, normal S1, S2, no murmurs                         Lungs: CTA bilaterally, no wheezes, rales, or rhonchi      Assessment: Acute pharyngitis, unspecified etiology  Sore throat - Plan: Rapid Strep A    Plan: Discussed diagnosis and treatment of viral URI and possibly an early sinusitis. He will do symptomatic treatment and call or return if worsening in 48 hour   Suggested symptomatic OTC remedies. Salt water gargles or chloraseptic.  Nasal saline spray for congestion. He may use Flonase since he has this at home.    Tylenol or Ibuprofen OTC for fever and malaise.  Call/return in 2-3 days if symptoms aren't resolving.

## 2017-08-07 NOTE — Patient Instructions (Signed)
Your rapid strep swab is negative.  I suspect your symptoms are related to a viral illness and recommend treating your symptoms at this point.  Mucinex DM for congestion and cough, drink extra water, use salt water gargles for throat irritation and Chloraseptic as well if needed.  Take Tylenol or Ibuprofen for aches and pains.  Call if you are not improving or if any of your symptoms get worse over the next 48 hours.

## 2017-08-08 ENCOUNTER — Encounter: Payer: Self-pay | Admitting: Medical

## 2017-08-08 ENCOUNTER — Telehealth: Payer: Self-pay | Admitting: Medical

## 2017-08-08 NOTE — Telephone Encounter (Signed)
Ok to give a work note for today.

## 2017-08-08 NOTE — Telephone Encounter (Signed)
Pt called and is requesting a letter for work today he did not go he was still not feeling better, he was still having congestion, swollen throat, facial pain, and ear pain, pt plans on rturing to work tomorrow, pt can be reached at [phone]

## 2017-08-08 NOTE — Telephone Encounter (Signed)
Called and informed pt that letter was ready

## 2017-08-10 ENCOUNTER — Ambulatory Visit: Payer: Managed Care, Other (non HMO) | Admitting: Family Medicine

## 2017-09-07 ENCOUNTER — Encounter: Payer: Self-pay | Admitting: Family Medicine

## 2017-09-07 ENCOUNTER — Ambulatory Visit: Payer: Managed Care, Other (non HMO) | Admitting: Family Medicine

## 2017-09-07 ENCOUNTER — Telehealth: Payer: Self-pay | Admitting: Family Medicine

## 2017-09-07 VITALS — BP 120/82 | HR 86 | Temp 98.1°F | Ht 69.0 in | Wt 249.2 lb

## 2017-09-07 DIAGNOSIS — M25512 Pain in left shoulder: Secondary | ICD-10-CM | POA: Diagnosis not present

## 2017-09-07 DIAGNOSIS — F321 Major depressive disorder, single episode, moderate: Secondary | ICD-10-CM | POA: Diagnosis not present

## 2017-09-07 MED ORDER — CITALOPRAM HYDROBROMIDE 20 MG PO TABS: 20.0000 mg | ORAL_TABLET | Freq: Every day | ORAL | 1 refills | Status: DC

## 2017-09-07 MED ORDER — OMEPRAZOLE MAGNESIUM 20 MG PO TBEC: 20.0000 mg | DELAYED_RELEASE_TABLET | Freq: Every day | ORAL | 3 refills | Status: DC

## 2017-09-07 NOTE — Addendum Note (Signed)
Addended by: Ronnald NianLALONDE, Gradie Butrick C on: 09/07/2017 04:38 PM   Modules accepted: Orders

## 2017-09-07 NOTE — Telephone Encounter (Signed)
Pt stated when checking out he has no more refills of Citalopram and would like to get his Omeprazole refilled

## 2017-09-07 NOTE — Progress Notes (Signed)
   Subjective:    Patient ID: Brett Gonzalez, male    DOB: 02/09/1991, 27 y.o.   MRN: [id number]  HPI He is here for a recheck.  He has done quite nicely on the Celexa 20 mg.  He feels as if he is back to normal but has noted 3 separate episodes where he became short of breath, having chest tightness and feeling out of sorts.  They were not necessarily all related to one particular activity.  This has some quite concerned.  He is looking for some help. He also complains of left shoulder discomfort.  He notes that it bothers him with internal rotation.  No history of overuse or previous injury.  No numbness, tingling or weakness.   Review of Systems     Objective:   Physical Exam Alert and in no distress with appropriate affect. Left shoulder exam shows full motion without pain negative sulcus test.  No tenderness over bicipital groove.  Drop arm test is negative.  Supraspinatus testing was negative but uncomfortable.  Neer's and Hawkins test negative.       Assessment & Plan:  Depression, major, single episode, moderate (HCC)  Acute pain of left shoulder I discussed the panic symptoms with him.  Explained that I think the best way to handle this would be to get involved in counseling.  Also discussed either increasing his Celexa or giving him something for the anxiety but at this point he is comfortable with maintaining the present medication regimen.  We will also refer him to Evette CristalKenneth Frazier For the shoulder I recommended conservative care with gentle range of motion exercises and Advil or Aleve.  If continued difficulty he will return here for further evaluation. Over 25 minutes, greater than 50% spent in counseling and coordination of care.

## 2017-09-07 NOTE — Patient Instructions (Signed)
Ken Frazier [phone]. 

## 2018-01-11 ENCOUNTER — Ambulatory Visit: Payer: Managed Care, Other (non HMO) | Admitting: Family Medicine

## 2018-01-11 ENCOUNTER — Encounter: Payer: Self-pay | Admitting: Family Medicine

## 2018-01-11 VITALS — BP 120/76 | HR 107 | Temp 98.0°F | Wt 250.6 lb

## 2018-01-11 DIAGNOSIS — F321 Major depressive disorder, single episode, moderate: Secondary | ICD-10-CM | POA: Diagnosis not present

## 2018-01-11 DIAGNOSIS — Z23 Encounter for immunization: Secondary | ICD-10-CM

## 2018-01-11 MED ORDER — CITALOPRAM HYDROBROMIDE 20 MG PO TABS: 20.0000 mg | ORAL_TABLET | Freq: Every day | ORAL | 3 refills | Status: DC

## 2018-01-11 NOTE — Progress Notes (Signed)
   Subjective:    Patient ID: Brett Gonzalez, male    DOB: 08/09/1990, 27 y.o.   MRN: [id number]  HPI He is here for recheck.  He has been on Celexa and has done quite nicely on that medication.  On his own he has learned valuable life lessons concerning stress, opening up to trusted individuals, letting go of negative thoughts.   Review of Systems     Objective:   Physical Exam Alert and in no distress otherwise not examined Immunizations were reviewed.     Assessment & Plan:  Depression, major, single episode, moderate (HCC) - Plan: citalopram (CELEXA) 20 MG tablet  Need for Tdap vaccination - Plan: Tdap vaccine greater than or equal to 7yo IM Discussed stopping the Celexa but at this point he would like to continue for another year.  We will readdress the issue in 1 year and recommend he set up for complete exam.

## 2018-01-22 ENCOUNTER — Ambulatory Visit: Payer: Managed Care, Other (non HMO) | Admitting: Family Medicine

## 2018-01-22 ENCOUNTER — Encounter: Payer: Self-pay | Admitting: Family Medicine

## 2018-01-22 VITALS — BP 122/76 | HR 70 | Temp 98.2°F | Wt 263.0 lb

## 2018-01-22 DIAGNOSIS — S39012A Strain of muscle, fascia and tendon of lower back, initial encounter: Secondary | ICD-10-CM | POA: Diagnosis not present

## 2018-01-22 MED ORDER — CARISOPRODOL 350 MG PO TABS: 350.0000 mg | ORAL_TABLET | Freq: Two times a day (BID) | ORAL | 0 refills | Status: DC | PRN

## 2018-01-22 NOTE — Progress Notes (Signed)
   Subjective:    Patient ID: Brett AntonMatthew T Chachere, male    DOB: 07/13/1990, 27 y.o.   MRN: [id number]  HPI He states that Saturday he was lifting something in an awkward position and felt some left-sided back pain.  Presently he is mainly just sore he does note difficulty with lifting heavy objects but no numbness, tingling or weakness.   Review of Systems     Objective:   Physical Exam Alert and in no distress.  Slight splinting noted when he stood up.  Palpable tenderness to the left upper lumbar paravertebral muscles with some pain on motion of the back.       Assessment & Plan:  Back strain, initial encounter - Plan: carisoprodol (SOMA) 350 MG tablet Instructions on proper back care given in the AVS.  Discussed proper posturing, lifting, and use of 800 mg 3 times daily of ibuprofen as well as Soma which is to be used mainly at night.  Cautioned about taking the medication and driving.  He will call if further difficulty.

## 2018-01-22 NOTE — Patient Instructions (Signed)
You can take 800 mg of ibuprofen 3 times per dayLow Back Strain A strain is a stretch or tear in a muscle or the strong cords of tissue that attach muscle to bone (tendons). Strains of the lower back (lumbar spine) are a common cause of low back pain. A strain occurs when muscles or tendons are torn or are stretched beyond their limits. The muscles may become inflamed, resulting in pain and sudden muscle tightening (spasms). A strain can happen suddenly due to an injury (trauma), or it can develop gradually due to overuse. There are three types of strains:  Grade 1 is a mild strain involving a minor tear of the muscle fibers or tendons. This may cause some pain but no loss of muscle strength.  Grade 2 is a moderate strain involving a partial tear of the muscle fibers or tendons. This causes more severe pain and some loss of muscle strength.  Grade 3 is a severe strain involving a complete tear of the muscle or tendon. This causes severe pain and complete or nearly complete loss of muscle strength.  What are the causes? This condition may be caused by:  Trauma, such as a fall or a hit to the body.  Twisting or overstretching the back. This may result from doing activities that require a lot of energy, such as lifting heavy objects.  What increases the risk? The following factors may increase your risk of getting this condition:  Playing contact sports.  Participating in sports or activities that put excessive stress on the back and require a lot of bending and twisting, including: ? Lifting weights or heavy objects. ? Gymnastics. ? Soccer. ? Figure skating. ? Snowboarding.  Being overweight or obese.  Having poor strength and flexibility.  What are the signs or symptoms? Symptoms of this condition may include:  Sharp or dull pain in the lower back that does not go away. Pain may extend to the buttocks.  Stiffness.  Limited range of motion.  Inability to stand up straight due  to stiffness or pain.  Muscle spasms.  How is this diagnosed? This condition may be diagnosed based on:  Your symptoms.  Your medical history.  A physical exam. ? Your health care provider may push on certain areas of your back to determine the source of your pain. ? You may be asked to bend forward, backward, and side to side to assess the severity of your pain and your range of motion.  Imaging tests, such as: ? X-rays. ? MRI.  How is this treated? Treatment for this condition may include:  Applying heat and cold to the affected area.  Medicines to help relieve pain and to relax your muscles (muscle relaxants).  NSAIDs to help reduce swelling and discomfort.  Physical therapy.  When your symptoms improve, it is important to gradually return to your normal routine as soon as possible to reduce pain, avoid stiffness, and avoid loss of muscle strength. Generally, symptoms should improve within 6 weeks of treatment. However, recovery time varies. Follow these instructions at home: Managing pain, stiffness, and swelling  If directed, apply ice to the injured area during the first 24 hours after your injury. ? Put ice in a plastic bag. ? Place a towel between your skin and the bag. ? Leave the ice on for 20 minutes, 2-3 times a day.  If directed, apply heat to the affected area as often as told by your health care provider. Use the heat source that your  health care provider recommends, such as a moist heat pack or a heating pad. ? Place a towel between your skin and the heat source. ? Leave the heat on for 20-30 minutes. ? Remove the heat if your skin turns bright red. This is especially important if you are unable to feel pain, heat, or cold. You may have a greater risk of getting burned. Activity  Rest and return to your normal activities as told by your health care provider. Ask your health care provider what activities are safe for you.  Avoid activities that take a lot  of effort (are strenuous) for as long as told by your health care provider.  Do exercises as told by your health care provider. General instructions   Take over-the-counter and prescription medicines only as told by your health care provider.  If you have questions or concerns about safety while taking pain medicine, talk with your health care provider.  Do not drive or operate heavy machinery until you know how your pain medicine affects you.  Do not use any tobacco products, such as cigarettes, chewing tobacco, and e-cigarettes. Tobacco can delay bone healing. If you need help quitting, ask your health care provider.  Keep all follow-up visits as told by your health care provider. This is important. How is this prevented?  Warm up and stretch before being active.  Cool down and stretch after being active.  Give your body time to rest between periods of activity.  Avoid: ? Being physically inactive for long periods at a time. ? Exercising or playing sports when you are tired or in pain.  Use correct form when playing sports and lifting heavy objects.  Use good posture when sitting and standing.  Maintain a healthy weight.  Sleep on a mattress with medium firmness to support your back.  Make sure to use equipment that fits you, including shoes that fit well.  Be safe and responsible while being active to avoid falls.  Do at least 150 minutes of moderate-intensity exercise each week, such as brisk walking or water aerobics. Try a form of exercise that takes stress off your back, such as swimming or stationary cycling.  Maintain physical fitness, including: ? Strength. ? Flexibility. ? Cardiovascular fitness. ? Endurance. Contact a health care provider if:  Your back pain does not improve after 6 weeks of treatment.  Your symptoms get worse. Get help right away if:  Your back pain is severe.  You are unable to stand or walk.  You develop pain in your  legs.  You develop weakness in your buttocks or legs.  You have difficulty controlling when you urinate or when you have a bowel movement. This information is not intended to replace advice given to you by your health care provider. Make sure you discuss any questions you have with your health care provider. Document Released: 05/22/2005 Document Revised: 01/27/2016 Document Reviewed: 03/03/2015 Elsevier Interactive Patient Education  2017 [employer]vier [employer]. You can take 800 mg of ibuprofen 3 times per day.  Heat for 20 minutes 3 times per day with proper posturing and I will call in a muscle relaxer

## 2018-01-23 ENCOUNTER — Telehealth: Payer: Self-pay

## 2018-01-23 NOTE — Telephone Encounter (Signed)
Pt stated he woke up this morning and his back is still swollen and he's not going to be able to stand on the concrete for 11 hours at work today. He wants to know if he can be written out of work for today. Please advise  If being out of work today is approved please fax letter to [phone].

## 2018-01-23 NOTE — Telephone Encounter (Signed)
Give him a note for today and tomorrow and schedule him for Friday to see me

## 2018-01-23 NOTE — Telephone Encounter (Signed)
Pt called again and asked to be called when Dr. Susann GivensLalonde repond about his work note and also pt wants to know if he will need to be seen if back is still hurting tomorrow

## 2018-01-25 ENCOUNTER — Ambulatory Visit: Payer: Managed Care, Other (non HMO) | Admitting: Family Medicine

## 2018-01-25 ENCOUNTER — Encounter: Payer: Self-pay | Admitting: Family Medicine

## 2018-01-25 VITALS — BP 138/82 | HR 84 | Temp 98.0°F | Wt 253.9 lb

## 2018-01-25 DIAGNOSIS — S39012A Strain of muscle, fascia and tendon of lower back, initial encounter: Secondary | ICD-10-CM

## 2018-01-25 NOTE — Patient Instructions (Signed)
800 mg of ibuprofen 3 times per day.  Heat for 20 minutes 3 times per day with stretching after that.  Proper posturing is that showed

## 2018-01-25 NOTE — Progress Notes (Signed)
   Subjective:    Patient ID: Brett Gonzalez, male    DOB: 08/14/1990, 27 y.o.   MRN: [id number]  HPI He is here for a recheck.  He states that he is roughly 50% better but has been unable to go to work due to the pain.  He has been using heat and a muscle relaxer at night.  He is only taking ibuprofen 200 mg 3 times daily.   Review of Systems     Objective:   Physical Exam Alert and in no distress.  Palpable tenderness in the left paravertebral muscles approximately half the size of the previous encounter.      Assessment & Plan:  Back strain, initial encounter Recommend he continue with heat, stretching, muscle relaxer mainly at night but to take 800 mg of ibuprofen 3 times per day.  I will give him a note to be able to return to work Monday.

## 2018-01-28 NOTE — Telephone Encounter (Signed)
Done

## 2018-05-14 ENCOUNTER — Telehealth: Payer: Self-pay

## 2018-05-14 NOTE — Telephone Encounter (Signed)
Called pt to confirm if he was still taking Citalopram 20 mg. Per Jcl he wanted him to continue this med . No answer and could not leave vm due to it not being set up. KH

## 2018-05-14 NOTE — Telephone Encounter (Signed)
Pt called back and he says he still taking Celexa. KH

## 2018-06-05 DIAGNOSIS — K5792 Diverticulitis of intestine, part unspecified, without perforation or abscess without bleeding: Secondary | ICD-10-CM

## 2018-06-05 HISTORY — DX: Diverticulitis of intestine, part unspecified, without perforation or abscess without bleeding: K57.92

## 2018-07-03 ENCOUNTER — Other Ambulatory Visit: Payer: Self-pay | Admitting: Family Medicine

## 2018-07-03 ENCOUNTER — Ambulatory Visit: Payer: Self-pay

## 2018-07-03 DIAGNOSIS — M25572 Pain in left ankle and joints of left foot: Secondary | ICD-10-CM

## 2018-09-10 ENCOUNTER — Other Ambulatory Visit: Payer: Self-pay | Admitting: Family Medicine

## 2018-11-12 ENCOUNTER — Other Ambulatory Visit: Payer: Self-pay | Admitting: Family Medicine

## 2018-11-13 ENCOUNTER — Encounter: Payer: Self-pay | Admitting: Gastroenterology

## 2018-11-18 ENCOUNTER — Other Ambulatory Visit: Payer: Self-pay

## 2018-11-18 ENCOUNTER — Ambulatory Visit: Payer: Self-pay | Admitting: Gastroenterology

## 2018-11-18 ENCOUNTER — Encounter: Payer: Self-pay | Admitting: Medical

## 2018-11-18 ENCOUNTER — Ambulatory Visit: Payer: BC Managed Care – PPO | Admitting: Medical

## 2018-11-18 VITALS — BP 110/80 | HR 74 | Temp 98.3°F | Ht 69.0 in | Wt 252.2 lb

## 2018-11-18 DIAGNOSIS — Z0000 Encounter for general adult medical examination without abnormal findings: Secondary | ICD-10-CM

## 2018-11-18 DIAGNOSIS — Z720 Tobacco use: Secondary | ICD-10-CM | POA: Insufficient documentation

## 2018-11-18 DIAGNOSIS — Z113 Encounter for screening for infections with a predominantly sexual mode of transmission: Secondary | ICD-10-CM

## 2018-11-18 DIAGNOSIS — F325 Major depressive disorder, single episode, in full remission: Secondary | ICD-10-CM

## 2018-11-18 DIAGNOSIS — Z131 Encounter for screening for diabetes mellitus: Secondary | ICD-10-CM | POA: Diagnosis not present

## 2018-11-18 DIAGNOSIS — L989 Disorder of the skin and subcutaneous tissue, unspecified: Secondary | ICD-10-CM

## 2018-11-18 DIAGNOSIS — K5792 Diverticulitis of intestine, part unspecified, without perforation or abscess without bleeding: Secondary | ICD-10-CM | POA: Insufficient documentation

## 2018-11-18 DIAGNOSIS — K219 Gastro-esophageal reflux disease without esophagitis: Secondary | ICD-10-CM

## 2018-11-18 DIAGNOSIS — Z6837 Body mass index (BMI) 37.0-37.9, adult: Secondary | ICD-10-CM

## 2018-11-18 MED ORDER — OMEPRAZOLE 20 MG PO CPDR: DELAYED_RELEASE_CAPSULE | ORAL | 3 refills | Status: DC

## 2018-11-18 NOTE — Progress Notes (Signed)
Subjective:   HPI  Brett Gonzalez is a 28 y.o. male who presents for Chief Complaint  Patient presents with  . Annual Exam    with fasting labs     Patient Care Team: Shlome Baldree, Cleda Mccreedyavid S, PA-C as PCP - General (Family Medicine) Sees dentist Sees eye doctor  Concerns: Recent in May 2020 was [facility] [place], [place] for new onset diverticulitis and small kidney stone in bladder.   Suppose to see surgeon in [place] this month for colonoscopy.   Finished round on antibiotics for diverticulitis.   No other new issues.    Has skin lesion on right thigh, under skin like a boil, been there at least 5 years.  Every now and there is a concern?  Reviewed their medical, surgical, family, social, medication, and allergy history and updated chart as appropriate.  Past Medical History:  Diagnosis Date  . ADD (attention deficit disorder)   . Asthma    worse in childhood  . Diverticulitis 2020  . GERD (gastroesophageal reflux disease)   . Wears contact lenses     Past Surgical History:  Procedure Laterality Date  . I&D EXTREMITY Right 04/11/2014   Procedure: IRRIGATION AND DEBRIDEMENT RIGHT HAND AND MIDDLE FINGER WITH ;  Surgeon: Dominica SeverinWilliam Gramig, MD;  Location: [facility] OR;  Service: Orthopedics;  Laterality: Right;  . REPAIR EXTENSOR TENDON Right 04/11/2014   Procedure: EXTENSOR TENDON REPAIR AND RECONSTRUCTION ;  Surgeon: Dominica SeverinWilliam Gramig, MD;  Location: [facility] OR;  Service: Orthopedics;  Laterality: Right;  . ROTATOR CUFF REPAIR Right   . TONSILLECTOMY    . VASECTOMY      Social History   Socioeconomic History  . Marital status: Married    Spouse name: Not on file  . Number of children: Not on file  . Years of education: Not on file  . Highest education level: Not on file  Occupational History  . Not on file  Social Needs  . Financial resource strain: Not on file  . Food insecurity    Worry: Not on file    Inability: Not on file  . Transportation needs    Medical: Not on  file    Non-medical: Not on file  Tobacco Use  . Smoking status: Former Smoker    Packs/day: 0.50    Years: 6.00    Pack years: 3.00    Quit date: 01/03/2014    Years since quitting: 4.8  . Smokeless tobacco: Current User    Types: Snuff  Substance and Sexual Activity  . Alcohol use: Yes    Alcohol/week: 1.0 standard drinks    Types: 1 Cans of beer per week    Comment: rare  . Drug use: No  . Sexual activity: Not on file  Lifestyle  . Physical activity    Days per week: Not on file    Minutes per session: Not on file  . Stress: Not on file  Relationships  . Social Musicianconnections    Talks on phone: Not on file    Gets together: Not on file    Attends religious service: Not on file    Active member of club or organization: Not on file    Attends meetings of clubs or organizations: Not on file    Relationship status: Not on file  . Intimate partner violence    Fear of current or ex partner: Not on file    Emotionally abused: Not on file    Physically abused: Not  on file    Forced sexual activity: Not on file  Other Topics Concern  . Not on file  Social History Narrative   Lives with girlfriend, has 2 children, sees them every weekend, works at [employer], builds [employer].   Walks, runs, dirt bike, plays with kids 11/2018.     Family History  Problem Relation Age of Onset  . Arthritis Mother   . Diabetes Mother   . Gallbladder disease Mother   . GER disease Mother   . Diverticulitis Mother   . Hypertension Father   . Arthritis Father   . Anxiety disorder Father   . Cancer Maternal Grandfather   . Stroke Paternal Grandfather   . Heart disease Neg Hx      Current Outpatient Medications:  .  citalopram (CELEXA) 20 MG tablet, Take 1 tablet (20 mg total) by mouth daily., Disp: 90 tablet, Rfl: 3 .  omeprazole (PRILOSEC) 20 MG capsule, TAKE (1) CAPSULE BY MOUTH ONCE DAILY., Disp: 90 capsule, Rfl: 3  Allergies  Allergen Reactions  . Codeine Swelling      Review of Systems Constitutional: -fever, -chills, -sweats, -unexpected weight change, -decreased appetite, -fatigue Allergy: -sneezing, -itching, -congestion Dermatology: -changing moles, --rash, -lumps ENT: -runny nose, -ear pain, -sore throat, -hoarseness, -sinus pain, -teeth pain, - ringing in ears, -hearing loss, -nosebleeds Cardiology: -chest pain, -palpitations, -swelling, -difficulty breathing when lying flat, -waking up short of breath Respiratory: -cough, -shortness of breath, -difficulty breathing with exercise or exertion, -wheezing, -coughing up blood Gastroenterology: -abdominal pain, -nausea, -vomiting, -diarrhea, -constipation, -blood in stool, -changes in bowel movement, -difficulty swallowing or eating Hematology: -bleeding, -bruising  Musculoskeletal: -joint aches, -muscle aches, -joint swelling, -back pain, -neck pain, -cramping, -changes in gait Ophthalmology: denies vision changes, eye redness, itching, discharge Urology: -burning with urination, -difficulty urinating, -blood in urine, -urinary frequency, -urgency, -incontinence Neurology: -headache, -weakness, -tingling, -numbness, -memory loss, -falls, -dizziness Psychology: -depressed mood, -agitation, -sleep problems Male GU: no testicular mass, pain, no lymph nodes swollen, no swelling, no rash.     Objective:  BP 110/80   Pulse 74   Temp 98.3 F (36.8 C) (Oral)   Ht 5\' 9"  (1.753 m)   Wt 252 lb 3.2 oz (114.4 kg)   SpO2 97%   BMI 37.24 kg/m   General appearance: alert, no distress, WD/WN, Caucasian male Skin: left distal anterior ankle with 65mm raised round dense lesion pink unchanged for years per patient, right buttock inferior lateral round mobile 1cm cystic subcutaneous lesion unchanged per years per patient, otherwise scattered macules, no other worrisome lesions  HEENT: normocephalic, conjunctiva/corneas normal, sclerae anicteric, PERRLA, EOMi, nares patent, no discharge or erythema, pharynx  normal Oral cavity: MMM, tongue normal, teeth normal Neck: supple, no lymphadenopathy, no thyromegaly, no masses, normal ROM, no bruits Chest: non tender, normal shape and expansion Heart: RRR, normal S1, S2, no murmurs Lungs: CTA bilaterally, no wheezes, rhonchi, or rales Abdomen: +bs, soft, mild left sided generalized tenderness, otherwise non tender, non distended, no masses, no hepatomegaly, no splenomegaly, no bruits Back: non tender, normal ROM, no scoliosis Musculoskeletal: upper extremities non tender, no obvious deformity, normal ROM throughout, lower extremities non tender, no obvious deformity, normal ROM throughout Extremities: no edema, no cyanosis, no clubbing Pulses: 2+ symmetric, upper and lower extremities, normal cap refill Neurological: alert, oriented x 3, CN2-12 intact, strength normal upper extremities and lower extremities, sensation normal throughout, DTRs 2+ throughout, no cerebellar signs, gait normal Psychiatric: normal affect, behavior normal, pleasant  GU: normal  male external genitalia,circumcised, nontender, no masses, no hernia, no lymphadenopathy Rectal: deferred  Assessment and Plan :   Encounter Diagnoses  Name Primary?  . Encounter for health maintenance examination in adult Yes  . Screen for STD (sexually transmitted disease)   . Screening for diabetes mellitus   . Diverticulitis   . Depression, major, in remission (HCC)   . Chewing tobacco use   . BMI 37.0-37.9, adult   . Skin lesion   . Gastroesophageal reflux disease, esophagitis presence not specified     Physical exam - discussed and counseled on healthy lifestyle, diet, exercise, preventative care, vaccinations, sick and well care, proper use of emergency dept and after hours care, and addressed their concerns.    Health screening: See your eye doctor yearly for routine vision care. See your dentist yearly for routine dental care including hygiene visits twice yearly.  Discussed STD  testing, discussed prevention, condom use, means of transmission  Cancer screening Advised monthly self testicular exam  Vaccinations: Advised yearly influenza vaccine  Separate significant issues discussed: Diverticulitis- we will [facility] anemia, follow-up with surgeon as planned for colonoscopy and further eval  Chewing tobacco -advised complete cessation, discussed risks  BMI 37- discussed need for weight loss, counseled on diet and exercise  Skin lesions-reassured  GERD-does fine on low-dose omeprazole, discussed long-term risk of medication  We will [facility] from recent visit including recent labs  Depression in remission-does fine on current citalopram, been on this for at least a year now    Brett Gonzalez was seen today for annual exam.  Diagnoses and all orders for this visit:  Encounter for health maintenance examination in adult -     Hemoglobin A1c -     Lipid panel -     HIV Antibody (routine testing w rflx) -     RPR -     GC/Chlamydia Probe Amp  Screen for STD (sexually transmitted disease) -     HIV Antibody (routine testing w rflx) -     RPR -     GC/Chlamydia Probe Amp  Screening for diabetes mellitus -     Hemoglobin A1c  Diverticulitis  Depression, major, in remission (HCC)  Chewing tobacco use  BMI 37.0-37.9, adult  Skin lesion  Gastroesophageal reflux disease, esophagitis presence not specified  Other orders -     omeprazole (PRILOSEC) 20 MG capsule; TAKE (1) CAPSULE BY MOUTH ONCE DAILY.   Follow-up pending labs, yearly for physical

## 2018-11-19 LAB — LIPID PANEL
Chol/HDL Ratio: 8.2 ratio — ABNORMAL HIGH (ref 0.0–5.0)
Cholesterol, Total: 214 mg/dL — ABNORMAL HIGH (ref 100–199)
HDL: 26 mg/dL — ABNORMAL LOW (ref >39)
LDL Calculated: 135 mg/dL — ABNORMAL HIGH (ref 0–99)
Triglycerides: 267 mg/dL — ABNORMAL HIGH (ref 0–149)
VLDL Cholesterol Cal: 53 mg/dL — ABNORMAL HIGH (ref 5–40)

## 2018-11-19 LAB — HEMOGLOBIN A1C
Est. average glucose Bld gHb Est-mCnc: 103 mg/dL
Hgb A1c MFr Bld: 5.2 % (ref 4.8–5.6)

## 2018-11-19 LAB — HIV ANTIBODY (ROUTINE TESTING W REFLEX): HIV Screen 4th Generation wRfx: NONREACTIVE

## 2018-11-19 LAB — RPR: RPR Ser Ql: NONREACTIVE

## 2018-11-22 LAB — GC/CHLAMYDIA PROBE AMP
Chlamydia trachomatis, NAA: NEGATIVE
Neisseria Gonorrhoeae by PCR: NEGATIVE

## 2018-12-03 ENCOUNTER — Telehealth: Payer: Self-pay | Admitting: Medical

## 2018-12-03 NOTE — Telephone Encounter (Signed)
Received requested records from [facility] in [place].

## 2018-12-09 ENCOUNTER — Telehealth: Payer: Self-pay | Admitting: Medical

## 2018-12-09 NOTE — Telephone Encounter (Signed)
I did end up getting records from [facility].    See if he is making changes with health low fat , low sugar diet, working on regular exercise?  I would like him to work on setting some personal weight loss goals along with trying to eat healthy in general.  Per hospital notes, he was suppose to have follow up with Dr. Ladona Horns, and repeat colonoscopy 8 weeks after the initial May emergency visit.   Has he had this follow up and colonoscopy?

## 2018-12-10 NOTE — Telephone Encounter (Signed)
Yes he is on diet, he has lost 24 lb since May.   Suppose to follow up last week with Dr Tye Maryland and did not was having intestine flare up and fever so he could not be seen but tested for Covid instead, he is awaiting covid results be for he can reschedule with Dr Ladona Horns.

## 2018-12-10 NOTE — Telephone Encounter (Signed)
Patient has an appointment with GI 01-08-19.   I left message on voicemail for patient to call back for the other questions.

## 2019-01-06 ENCOUNTER — Ambulatory Visit: Payer: Self-pay | Admitting: Gastroenterology

## 2019-01-08 ENCOUNTER — Telehealth: Payer: Self-pay | Admitting: Gastroenterology

## 2019-01-08 ENCOUNTER — Ambulatory Visit: Payer: Self-pay | Admitting: Gastroenterology

## 2019-01-08 ENCOUNTER — Encounter: Payer: Self-pay | Admitting: Gastroenterology

## 2019-01-08 NOTE — Progress Notes (Deleted)
Primary Care Physician:  Carlena Hurl, PA-C  Primary Gastroenterologist:    No chief complaint on file.   HPI:  Brett Gonzalez is a 28 y.o. male here at the request of Chana Bode, PA-C for further evaluation of abdominal pain. Recent admission at [facility] [id number] for complicated diverticulitis.   Current Outpatient Medications  Medication Sig Dispense Refill  . citalopram (CELEXA) 20 MG tablet Take 1 tablet (20 mg total) by mouth daily. 90 tablet 3  . omeprazole (PRILOSEC) 20 MG capsule TAKE (1) CAPSULE BY MOUTH ONCE DAILY. 90 capsule 3   No current facility-administered medications for this visit.     Allergies as of 01/08/2019 - Review Complete 11/18/2018  Allergen Reaction Noted  . Codeine Swelling 04/11/2014    Past Medical History:  Diagnosis Date  . ADD (attention deficit disorder)   . Asthma    worse in childhood  . Diverticulitis 2020  . GERD (gastroesophageal reflux disease)   . Wears contact lenses     Past Surgical History:  Procedure Laterality Date  . I&D EXTREMITY Right 04/11/2014   Procedure: IRRIGATION AND DEBRIDEMENT RIGHT HAND AND MIDDLE FINGER WITH ;  Surgeon: Roseanne Kaufman, MD;  Location: [place];  Service: Orthopedics;  Laterality: Right;  . REPAIR EXTENSOR TENDON Right 04/11/2014   Procedure: EXTENSOR TENDON REPAIR AND RECONSTRUCTION ;  Surgeon: Roseanne Kaufman, MD;  Location: [place];  Service: Orthopedics;  Laterality: Right;  . ROTATOR CUFF REPAIR Right   . TONSILLECTOMY    . VASECTOMY      Family History  Problem Relation Age of Onset  . Arthritis Mother   . Diabetes Mother   . Gallbladder disease Mother   . GER disease Mother   . Diverticulitis Mother   . Hypertension Father   . Arthritis Father   . Anxiety disorder Father   . Cancer Maternal Grandfather   . Stroke Paternal Grandfather   . Heart disease Neg Hx     Social History   Socioeconomic History  . Marital status: Married    Spouse name: Not on file  . Number of  children: Not on file  . Years of education: Not on file  . Highest education level: Not on file  Occupational History  . Not on file  Social Needs  . Financial resource strain: Not on file  . Food insecurity    Worry: Not on file    Inability: Not on file  . Transportation needs    Medical: Not on file    Non-medical: Not on file  Tobacco Use  . Smoking status: Former Smoker    Packs/day: 0.50    Years: 6.00    Pack years: 3.00    Quit date: 01/03/2014    Years since quitting: 5.0  . Smokeless tobacco: Current User    Types: Snuff  Substance and Sexual Activity  . Alcohol use: Yes    Alcohol/week: 1.0 standard drinks    Types: 1 Cans of beer per week    Comment: rare  . Drug use: No  . Sexual activity: Not on file  Lifestyle  . Physical activity    Days per week: Not on file    Minutes per session: Not on file  . Stress: Not on file  Relationships  . Social Herbalist on phone: Not on file    Gets together: Not on file    Attends religious service: Not on file    Active member of club  or organization: Not on file    Attends meetings of clubs or organizations: Not on file    Relationship status: Not on file  . Intimate partner violence    Fear of current or ex partner: Not on file    Emotionally abused: Not on file    Physically abused: Not on file    Forced sexual activity: Not on file  Other Topics Concern  . Not on file  Social History Narrative   Lives with girlfriend, has 2 children, sees them every weekend, works at [employer], builds [employer].   Walks, runs, dirt bike, plays with kids 11/2018.       ROS:  General: Negative for anorexia, weight loss, fever, chills, fatigue, weakness. Eyes: Negative for vision changes.  ENT: Negative for hoarseness, difficulty swallowing , nasal congestion. CV: Negative for chest pain, angina, palpitations, dyspnea on exertion, peripheral edema.  Respiratory: Negative for dyspnea at rest, dyspnea on  exertion, cough, sputum, wheezing.  GI: See history of present illness. GU:  Negative for dysuria, hematuria, urinary incontinence, urinary frequency, nocturnal urination.  MS: Negative for joint pain, low back pain.  Derm: Negative for rash or itching.  Neuro: Negative for weakness, abnormal sensation, seizure, frequent headaches, memory loss, confusion.  Psych: Negative for anxiety, depression, suicidal ideation, hallucinations.  Endo: Negative for unusual weight change.  Heme: Negative for bruising or bleeding. Allergy: Negative for rash or hives.    Physical Examination:  There were no vitals taken for this visit.   General: Well-nourished, well-developed in no acute distress.  Head: Normocephalic, atraumatic.   Eyes: Conjunctiva pink, no icterus. Mouth: Oropharyngeal mucosa moist and pink , no lesions erythema or exudate. Neck: Supple without thyromegaly, masses, or lymphadenopathy.  Lungs: Clear to auscultation bilaterally.  Heart: Regular rate and rhythm, no murmurs rubs or gallops.  Abdomen: Bowel sounds are normal, nontender, nondistended, no hepatosplenomegaly or masses, no abdominal bruits or    hernia , no rebound or guarding.   Rectal: *** Extremities: No lower extremity edema. No clubbing or deformities.  Neuro: Alert and oriented x 4 , grossly normal neurologically.  Skin: Warm and dry, no rash or jaundice.   Psych: Alert and cooperative, normal mood and affect.  Labs: ***  Imaging Studies: No results found.

## 2019-01-08 NOTE — Telephone Encounter (Signed)
PATIENT WAS A NO SHOW AND LETTER SENT  °

## 2019-02-04 ENCOUNTER — Encounter: Payer: Self-pay | Admitting: Medical

## 2019-02-04 ENCOUNTER — Other Ambulatory Visit: Payer: Self-pay

## 2019-02-04 ENCOUNTER — Ambulatory Visit: Payer: BC Managed Care – PPO | Admitting: Medical

## 2019-02-04 VITALS — Temp 98.0°F | Ht 70.0 in | Wt 238.0 lb

## 2019-02-04 DIAGNOSIS — R112 Nausea with vomiting, unspecified: Secondary | ICD-10-CM | POA: Insufficient documentation

## 2019-02-04 DIAGNOSIS — R109 Unspecified abdominal pain: Secondary | ICD-10-CM | POA: Diagnosis not present

## 2019-02-04 DIAGNOSIS — R197 Diarrhea, unspecified: Secondary | ICD-10-CM | POA: Diagnosis not present

## 2019-02-04 DIAGNOSIS — Z8719 Personal history of other diseases of the digestive system: Secondary | ICD-10-CM

## 2019-02-04 MED ORDER — ONDANSETRON HCL 4 MG PO TABS: 4.0000 mg | ORAL_TABLET | Freq: Three times a day (TID) | ORAL | 0 refills | Status: DC | PRN

## 2019-02-04 MED ORDER — METRONIDAZOLE 500 MG PO TABS: 500.0000 mg | ORAL_TABLET | Freq: Three times a day (TID) | ORAL | 0 refills | Status: DC

## 2019-02-04 MED ORDER — PROMETHAZINE HCL 12.5 MG PO TABS: 12.5000 mg | ORAL_TABLET | Freq: Three times a day (TID) | ORAL | 0 refills | Status: DC | PRN

## 2019-02-04 MED ORDER — LEVOFLOXACIN 500 MG PO TABS: 500.0000 mg | ORAL_TABLET | Freq: Every day | ORAL | 0 refills | Status: DC

## 2019-02-04 NOTE — Progress Notes (Signed)
done

## 2019-02-04 NOTE — Progress Notes (Signed)
Subjective:     Patient ID: Brett Gonzalez, male   DOB: 02/09/1991, 28 y.o.   MRN: [id number]  This visit type was conducted due to national recommendations for restrictions regarding the COVID-19 Pandemic (e.g. social distancing) in an effort to limit this patient's exposure and mitigate transmission in our community.  Due to their co-morbid illnesses, this patient is at least at moderate risk for complications without adequate follow up.  This format is felt to be most appropriate for this patient at this time.    Documentation for virtual audio and video telecommunications through Zoom encounter:  The patient was located at home. The provider was located in the office. The patient did consent to this visit and is aware of possible charges through their insurance for this visit.  The other persons participating in this telemedicine service were none. Time spent on call was 20 minutes and in review of previous records >20 minutes total.  This virtual service is not related to other E/M service within previous 7 days.   HPI Chief Complaint  Patient presents with  . Diverticulitis    pain since last Friday with diarrhea,nausea, fatigue    Virtual consult for abdominal pain.  He has hx/o diverticulitis.    Has colonoscopy scheduled for 02/20/19.    6 days ago started feeling nauseated, mild abdominal discomfort, but the next day, low grade fever, diarrhea, abdominal cramping, worse nausea, vomiting.   Bad cramping for 2 days.   The next day felt a little better, but still diarrhea, nausea, some vomiting.  For a few days improved, then over last 2 days, symptoms flared back up.    Has nasty burps, lots of loose watery stool, nausea, abdominal cramping.   No blood in stool. has used some tylenol for pain.   Last round of antiboitcs was in July whne he saw the surgeon who will be doing the colonoscopy.   Was given Augmentin.  No recent travel, no recent camping.  No recent foods suspected of  food poisoning.  No sick contacts.  No other aggravating or relieving factors.   No other complaint.    Past Medical History:  Diagnosis Date  . ADD (attention deficit disorder)   . Asthma    worse in childhood  . Diverticulitis 2020  . GERD (gastroesophageal reflux disease)   . Wears contact lenses    Current Outpatient Medications on File Prior to Visit  Medication Sig Dispense Refill  . citalopram (CELEXA) 20 MG tablet Take 1 tablet (20 mg total) by mouth daily. 90 tablet 3  . omeprazole (PRILOSEC) 20 MG capsule TAKE (1) CAPSULE BY MOUTH ONCE DAILY. 90 capsule 3   No current facility-administered medications on file prior to visit.     Review of Systems As in subjective    Objective:   Physical Exam  Temp 98 F (36.7 C) (Oral)   Ht 5\' 10"  (1.778 m)   Wt 238 lb (108 kg)   BMI 34.15 kg/m   Wt Readings from Last 3 Encounters:  02/04/19 238 lb (108 kg)  11/18/18 252 lb 3.2 oz (114.4 kg)  01/25/18 253 lb 14.4 oz (115.2 kg)   Due to coronavirus pandemic stay at home measures, patient visit was virtual and they were not examined in person.        Assessment:     Encounter Diagnoses  Name Primary?  . Abdominal pain, unspecified abdominal location Yes  . Nausea and vomiting, intractability of vomiting not specified, unspecified  vomiting type   . Diarrhea, unspecified type   . History of diverticulitis        Plan:     Symptoms suggest flareup of diverticulitis.  He has had several bouts of this since his hospitalization earlier in the year with bowel perforation and diverticulitis.  We will use medication as below, can use Zofran or promethazine for nausea vice versa, caution on sedation with promethazine.  Advised clear fluids only today, no solid food until seeing some improvement in next 24 hours to 48 hours.  Advised if rapid pulse, elevated fever, worse pain, or uncontrollable symptoms they report to emergency department.  Follow-up with general surgeon this month  for colonoscopy as planned  Brett Gonzalez was seen today for diverticulitis.  Diagnoses and all orders for this visit:  Abdominal pain, unspecified abdominal location  Nausea and vomiting, intractability of vomiting not specified, unspecified vomiting type  Diarrhea, unspecified type  History of diverticulitis  Other orders -     metroNIDAZOLE (FLAGYL) 500 MG tablet; Take 1 tablet (500 mg total) by mouth 3 (three) times daily. -     levofloxacin (LEVAQUIN) 500 MG tablet; Take 1 tablet (500 mg total) by mouth daily. -     promethazine (PHENERGAN) 12.5 MG tablet; Take 1 tablet (12.5 mg total) by mouth every 8 (eight) hours as needed for nausea or vomiting. -     ondansetron (ZOFRAN) 4 MG tablet; Take 1 tablet (4 mg total) by mouth every 8 (eight) hours as needed for nausea or vomiting.

## 2019-02-11 ENCOUNTER — Other Ambulatory Visit: Payer: Self-pay | Admitting: Family Medicine

## 2019-02-11 DIAGNOSIS — F321 Major depressive disorder, single episode, moderate: Secondary | ICD-10-CM

## 2019-02-18 DIAGNOSIS — Z20828 Contact with and (suspected) exposure to other viral communicable diseases: Secondary | ICD-10-CM | POA: Diagnosis not present

## 2019-02-18 DIAGNOSIS — Z01812 Encounter for preprocedural laboratory examination: Secondary | ICD-10-CM | POA: Diagnosis not present

## 2019-02-19 DIAGNOSIS — K5720 Diverticulitis of large intestine with perforation and abscess without bleeding: Secondary | ICD-10-CM | POA: Diagnosis not present

## 2019-02-19 DIAGNOSIS — K5780 Diverticulitis of intestine, part unspecified, with perforation and abscess without bleeding: Secondary | ICD-10-CM | POA: Diagnosis not present

## 2019-02-19 DIAGNOSIS — K5792 Diverticulitis of intestine, part unspecified, without perforation or abscess without bleeding: Secondary | ICD-10-CM | POA: Diagnosis not present

## 2019-02-19 DIAGNOSIS — R1084 Generalized abdominal pain: Secondary | ICD-10-CM | POA: Diagnosis not present

## 2019-02-19 DIAGNOSIS — K5790 Diverticulosis of intestine, part unspecified, without perforation or abscess without bleeding: Secondary | ICD-10-CM | POA: Diagnosis not present

## 2019-02-19 DIAGNOSIS — K5740 Diverticulitis of both small and large intestine with perforation and abscess without bleeding: Secondary | ICD-10-CM | POA: Diagnosis not present

## 2019-02-26 DIAGNOSIS — Z4801 Encounter for change or removal of surgical wound dressing: Secondary | ICD-10-CM | POA: Diagnosis not present

## 2019-02-26 DIAGNOSIS — K5780 Diverticulitis of intestine, part unspecified, with perforation and abscess without bleeding: Secondary | ICD-10-CM | POA: Diagnosis not present

## 2019-02-28 DIAGNOSIS — Z4801 Encounter for change or removal of surgical wound dressing: Secondary | ICD-10-CM | POA: Diagnosis not present

## 2019-02-28 DIAGNOSIS — K5780 Diverticulitis of intestine, part unspecified, with perforation and abscess without bleeding: Secondary | ICD-10-CM | POA: Diagnosis not present

## 2019-03-04 DIAGNOSIS — Z933 Colostomy status: Secondary | ICD-10-CM | POA: Diagnosis not present

## 2019-03-05 DIAGNOSIS — K5780 Diverticulitis of intestine, part unspecified, with perforation and abscess without bleeding: Secondary | ICD-10-CM | POA: Diagnosis not present

## 2019-03-05 DIAGNOSIS — Z4801 Encounter for change or removal of surgical wound dressing: Secondary | ICD-10-CM | POA: Diagnosis not present

## 2019-03-07 DIAGNOSIS — Z433 Encounter for attention to colostomy: Secondary | ICD-10-CM | POA: Diagnosis not present

## 2019-03-07 DIAGNOSIS — Z4801 Encounter for change or removal of surgical wound dressing: Secondary | ICD-10-CM | POA: Diagnosis not present

## 2019-03-07 DIAGNOSIS — Z4802 Encounter for removal of sutures: Secondary | ICD-10-CM | POA: Diagnosis not present

## 2019-03-08 IMAGING — DX DG ANKLE COMPLETE 3+V*L*
3 series · 3 of 3 positions shown · non-contrast
Comparison: None.

CLINICAL DATA: Twisting injury yesterday with persistent pain,
initial encounter

EXAM:
LEFT ANKLE COMPLETE - 3+ VIEW

[ankle ap]
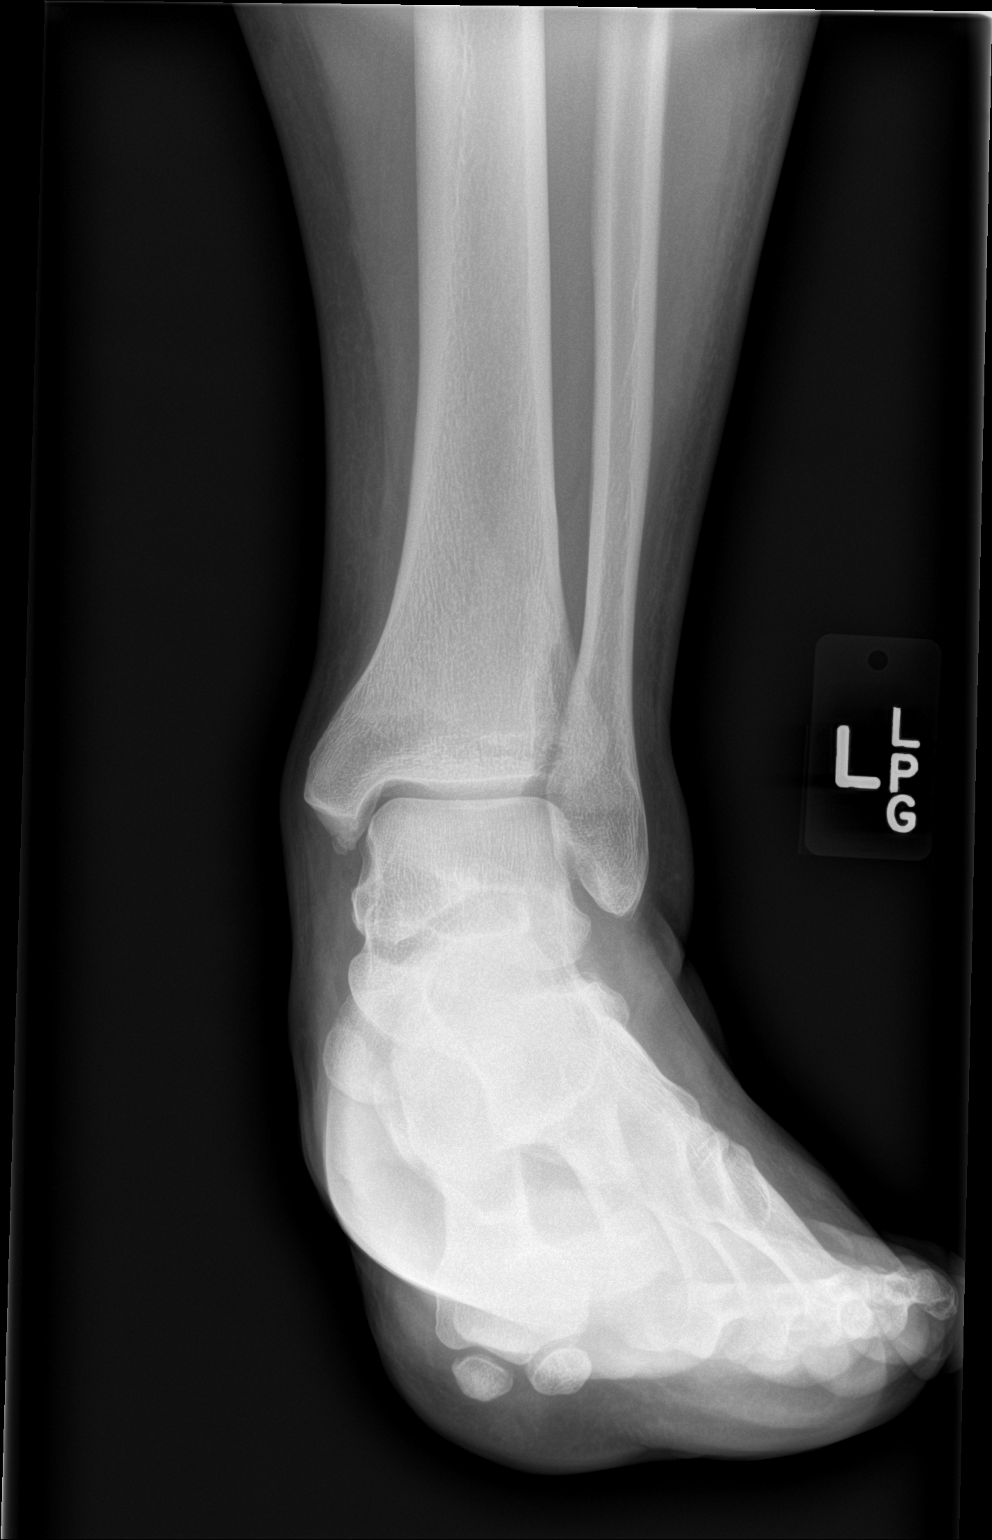

[ankle mortise]
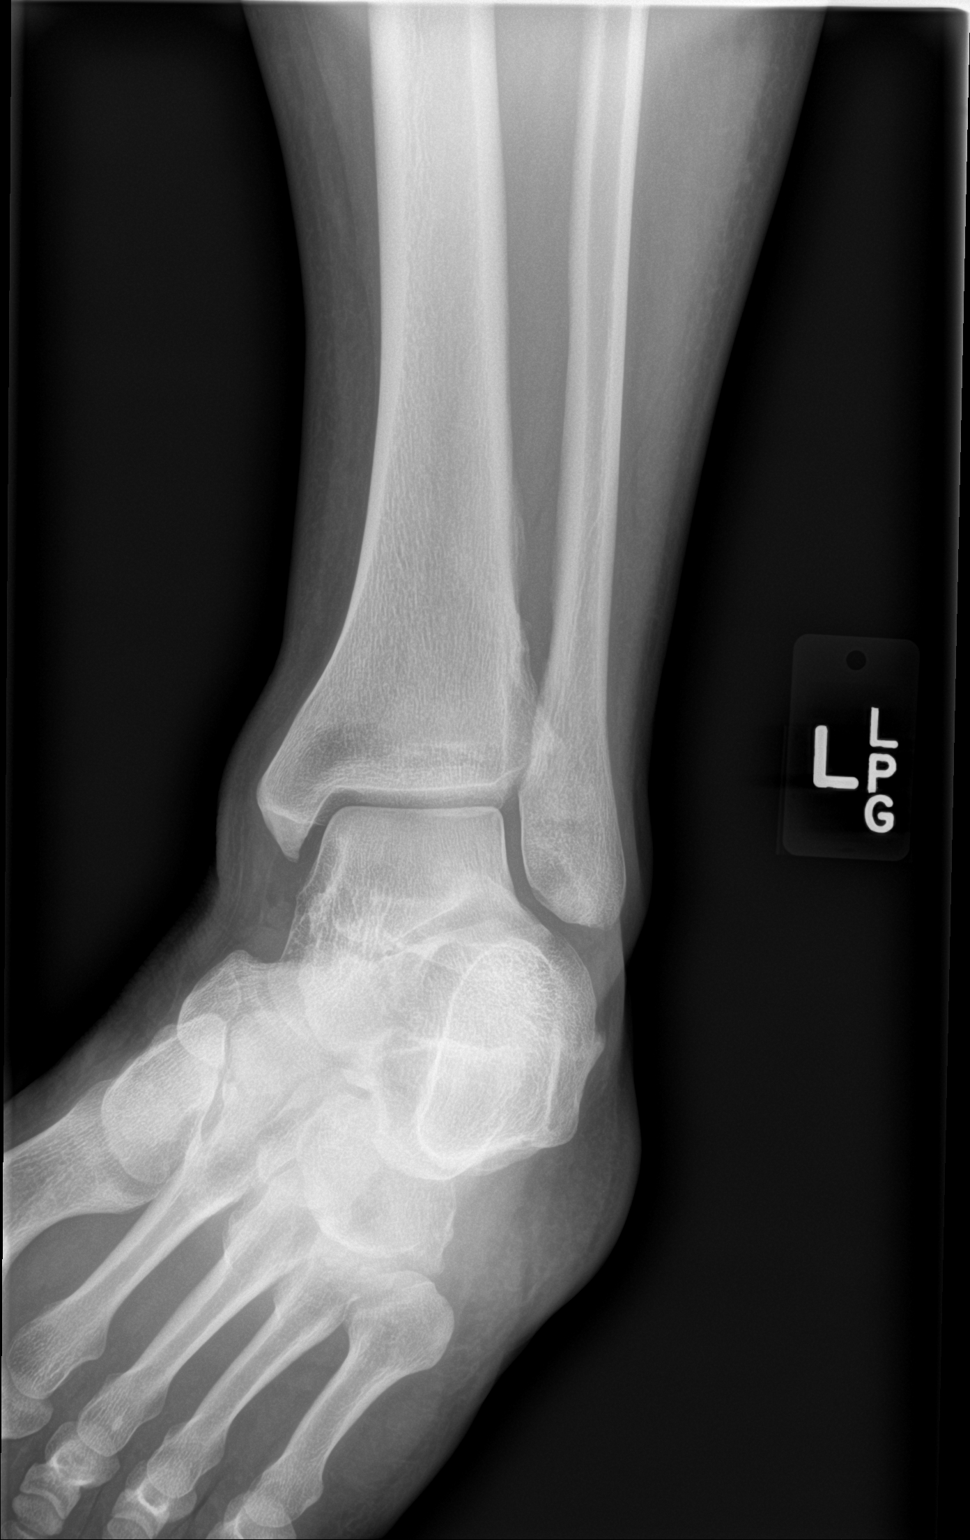

[ankle lat]
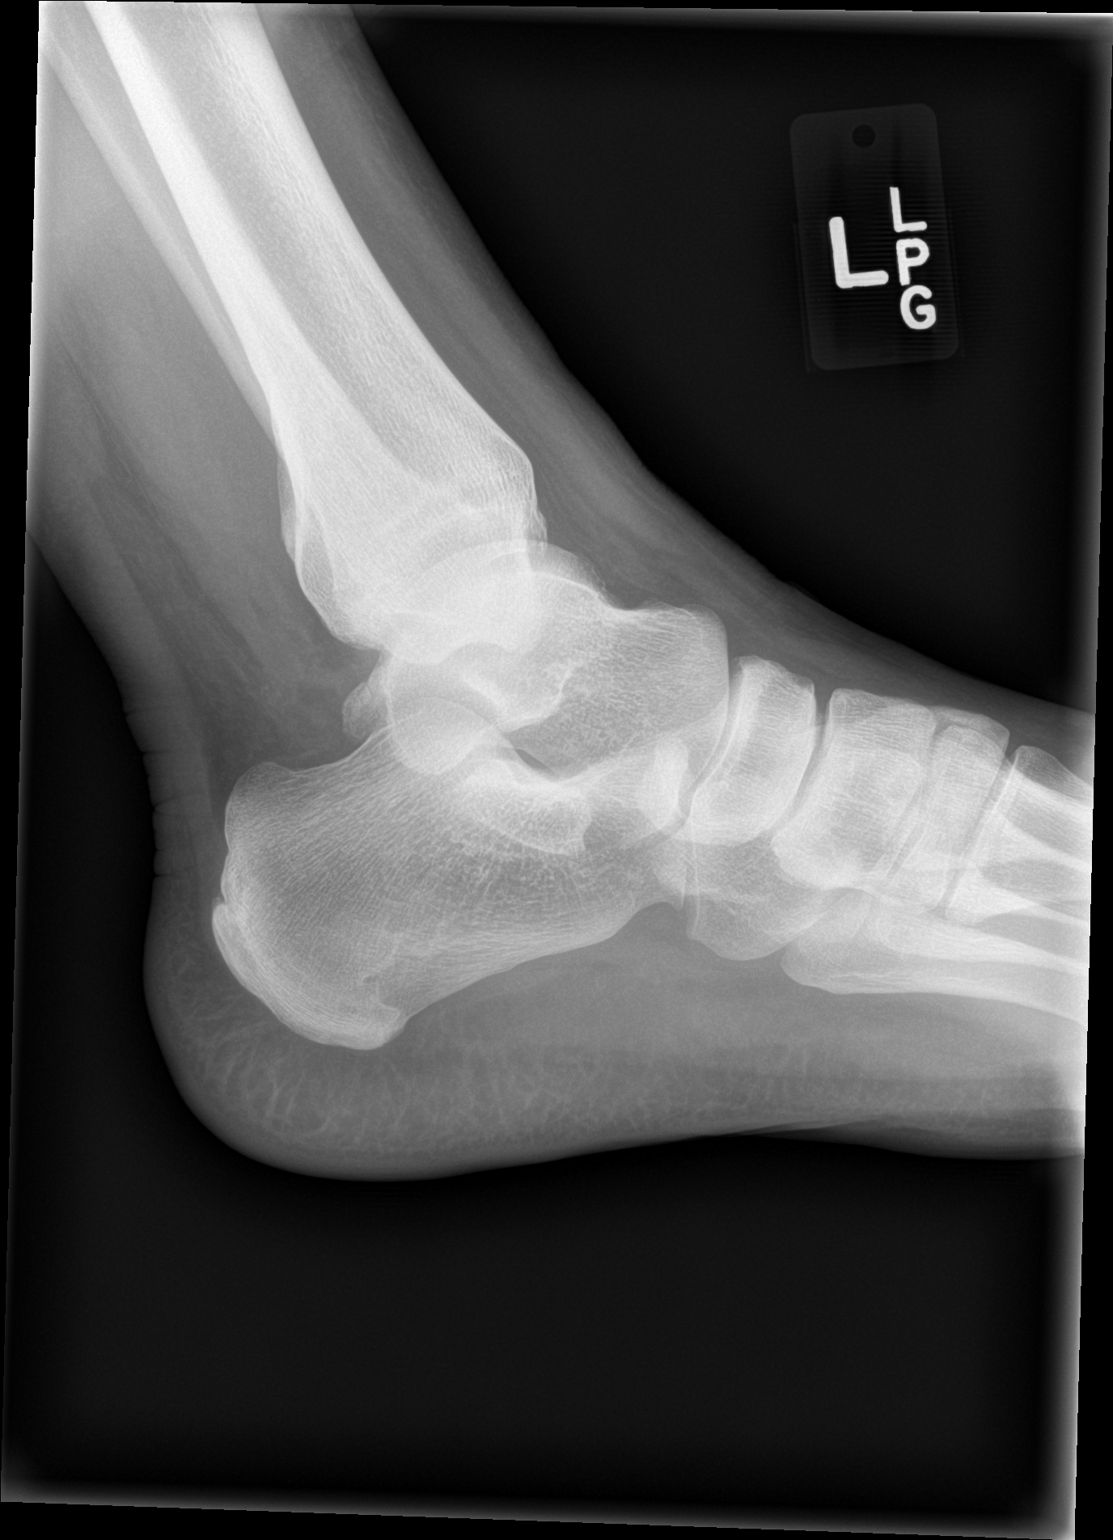

[3 of 3 positions shown; findings below may reference images not displayed]

FINDINGS: Lateral soft tissue swelling is noted. No acute fracture or
dislocation is noted.
IMPRESSION: Soft tissue swelling without acute bony abnormality.

## 2019-03-12 DIAGNOSIS — Z4801 Encounter for change or removal of surgical wound dressing: Secondary | ICD-10-CM | POA: Diagnosis not present

## 2019-03-12 DIAGNOSIS — Z4802 Encounter for removal of sutures: Secondary | ICD-10-CM | POA: Diagnosis not present

## 2019-03-12 DIAGNOSIS — Z433 Encounter for attention to colostomy: Secondary | ICD-10-CM | POA: Diagnosis not present

## 2019-03-13 DIAGNOSIS — Z4801 Encounter for change or removal of surgical wound dressing: Secondary | ICD-10-CM | POA: Diagnosis not present

## 2019-03-13 DIAGNOSIS — Z4802 Encounter for removal of sutures: Secondary | ICD-10-CM | POA: Diagnosis not present

## 2019-03-13 DIAGNOSIS — Z433 Encounter for attention to colostomy: Secondary | ICD-10-CM | POA: Diagnosis not present

## 2019-03-14 DIAGNOSIS — Z433 Encounter for attention to colostomy: Secondary | ICD-10-CM | POA: Diagnosis not present

## 2019-03-14 DIAGNOSIS — Z4801 Encounter for change or removal of surgical wound dressing: Secondary | ICD-10-CM | POA: Diagnosis not present

## 2019-03-14 DIAGNOSIS — Z4802 Encounter for removal of sutures: Secondary | ICD-10-CM | POA: Diagnosis not present

## 2019-03-17 DIAGNOSIS — Z433 Encounter for attention to colostomy: Secondary | ICD-10-CM | POA: Diagnosis not present

## 2019-03-17 DIAGNOSIS — Z4801 Encounter for change or removal of surgical wound dressing: Secondary | ICD-10-CM | POA: Diagnosis not present

## 2019-03-17 DIAGNOSIS — Z4802 Encounter for removal of sutures: Secondary | ICD-10-CM | POA: Diagnosis not present

## 2019-03-21 DIAGNOSIS — Z433 Encounter for attention to colostomy: Secondary | ICD-10-CM | POA: Diagnosis not present

## 2019-03-21 DIAGNOSIS — Z4802 Encounter for removal of sutures: Secondary | ICD-10-CM | POA: Diagnosis not present

## 2019-03-21 DIAGNOSIS — Z4801 Encounter for change or removal of surgical wound dressing: Secondary | ICD-10-CM | POA: Diagnosis not present

## 2019-03-24 DIAGNOSIS — Z4801 Encounter for change or removal of surgical wound dressing: Secondary | ICD-10-CM | POA: Diagnosis not present

## 2019-03-24 DIAGNOSIS — Z433 Encounter for attention to colostomy: Secondary | ICD-10-CM | POA: Diagnosis not present

## 2019-03-24 DIAGNOSIS — Z4802 Encounter for removal of sutures: Secondary | ICD-10-CM | POA: Diagnosis not present

## 2019-03-26 DIAGNOSIS — Z433 Encounter for attention to colostomy: Secondary | ICD-10-CM | POA: Diagnosis not present

## 2019-03-26 DIAGNOSIS — Z4801 Encounter for change or removal of surgical wound dressing: Secondary | ICD-10-CM | POA: Diagnosis not present

## 2019-03-26 DIAGNOSIS — Z4802 Encounter for removal of sutures: Secondary | ICD-10-CM | POA: Diagnosis not present

## 2019-05-07 DIAGNOSIS — Z933 Colostomy status: Secondary | ICD-10-CM | POA: Diagnosis not present

## 2019-06-13 DIAGNOSIS — Z01812 Encounter for preprocedural laboratory examination: Secondary | ICD-10-CM | POA: Diagnosis not present

## 2019-06-13 DIAGNOSIS — K5720 Diverticulitis of large intestine with perforation and abscess without bleeding: Secondary | ICD-10-CM | POA: Diagnosis not present

## 2019-06-13 DIAGNOSIS — Z01818 Encounter for other preprocedural examination: Secondary | ICD-10-CM | POA: Diagnosis not present

## 2019-06-16 DIAGNOSIS — Z91018 Allergy to other foods: Secondary | ICD-10-CM | POA: Diagnosis not present

## 2019-06-16 DIAGNOSIS — Z886 Allergy status to analgesic agent status: Secondary | ICD-10-CM | POA: Diagnosis not present

## 2019-06-16 DIAGNOSIS — K5720 Diverticulitis of large intestine with perforation and abscess without bleeding: Secondary | ICD-10-CM | POA: Diagnosis not present

## 2019-06-16 DIAGNOSIS — Z933 Colostomy status: Secondary | ICD-10-CM | POA: Diagnosis not present

## 2019-06-16 DIAGNOSIS — K219 Gastro-esophageal reflux disease without esophagitis: Secondary | ICD-10-CM | POA: Diagnosis not present

## 2019-06-16 DIAGNOSIS — F419 Anxiety disorder, unspecified: Secondary | ICD-10-CM | POA: Diagnosis not present

## 2019-06-16 DIAGNOSIS — E669 Obesity, unspecified: Secondary | ICD-10-CM | POA: Diagnosis not present

## 2019-06-16 DIAGNOSIS — F329 Major depressive disorder, single episode, unspecified: Secondary | ICD-10-CM | POA: Diagnosis not present

## 2019-06-16 DIAGNOSIS — F17290 Nicotine dependence, other tobacco product, uncomplicated: Secondary | ICD-10-CM | POA: Diagnosis not present

## 2019-06-16 DIAGNOSIS — Z6835 Body mass index (BMI) 35.0-35.9, adult: Secondary | ICD-10-CM | POA: Diagnosis not present

## 2019-06-17 DIAGNOSIS — Z452 Encounter for adjustment and management of vascular access device: Secondary | ICD-10-CM | POA: Diagnosis not present

## 2019-06-17 DIAGNOSIS — Z434 Encounter for attention to other artificial openings of digestive tract: Secondary | ICD-10-CM | POA: Diagnosis not present

## 2019-06-17 DIAGNOSIS — J9811 Atelectasis: Secondary | ICD-10-CM | POA: Diagnosis not present

## 2019-06-17 DIAGNOSIS — Z433 Encounter for attention to colostomy: Secondary | ICD-10-CM | POA: Diagnosis not present

## 2019-06-25 DIAGNOSIS — K5750 Diverticulosis of both small and large intestine without perforation or abscess without bleeding: Secondary | ICD-10-CM | POA: Diagnosis not present

## 2019-06-25 DIAGNOSIS — Z79891 Long term (current) use of opiate analgesic: Secondary | ICD-10-CM | POA: Diagnosis not present

## 2019-06-25 DIAGNOSIS — Z4801 Encounter for change or removal of surgical wound dressing: Secondary | ICD-10-CM | POA: Diagnosis not present

## 2019-06-25 DIAGNOSIS — Z48815 Encounter for surgical aftercare following surgery on the digestive system: Secondary | ICD-10-CM | POA: Diagnosis not present

## 2019-07-02 DIAGNOSIS — Z79891 Long term (current) use of opiate analgesic: Secondary | ICD-10-CM | POA: Diagnosis not present

## 2019-07-02 DIAGNOSIS — K5750 Diverticulosis of both small and large intestine without perforation or abscess without bleeding: Secondary | ICD-10-CM | POA: Diagnosis not present

## 2019-07-02 DIAGNOSIS — Z4801 Encounter for change or removal of surgical wound dressing: Secondary | ICD-10-CM | POA: Diagnosis not present

## 2019-07-02 DIAGNOSIS — Z48815 Encounter for surgical aftercare following surgery on the digestive system: Secondary | ICD-10-CM | POA: Diagnosis not present

## 2019-07-09 DIAGNOSIS — K5750 Diverticulosis of both small and large intestine without perforation or abscess without bleeding: Secondary | ICD-10-CM | POA: Diagnosis not present

## 2019-07-09 DIAGNOSIS — Z79891 Long term (current) use of opiate analgesic: Secondary | ICD-10-CM | POA: Diagnosis not present

## 2019-07-09 DIAGNOSIS — Z4801 Encounter for change or removal of surgical wound dressing: Secondary | ICD-10-CM | POA: Diagnosis not present

## 2019-07-09 DIAGNOSIS — Z48815 Encounter for surgical aftercare following surgery on the digestive system: Secondary | ICD-10-CM | POA: Diagnosis not present

## 2019-07-15 ENCOUNTER — Encounter: Payer: Self-pay | Admitting: Medical

## 2019-07-23 ENCOUNTER — Encounter: Payer: Self-pay | Admitting: Medical

## 2019-07-23 ENCOUNTER — Other Ambulatory Visit: Payer: Self-pay | Admitting: Medical

## 2019-07-23 DIAGNOSIS — F321 Major depressive disorder, single episode, moderate: Secondary | ICD-10-CM

## 2019-07-28 DIAGNOSIS — H11131 Conjunctival pigmentations, right eye: Secondary | ICD-10-CM | POA: Diagnosis not present

## 2019-07-28 DIAGNOSIS — H5213 Myopia, bilateral: Secondary | ICD-10-CM | POA: Diagnosis not present

## 2019-07-28 DIAGNOSIS — H52223 Regular astigmatism, bilateral: Secondary | ICD-10-CM | POA: Diagnosis not present

## 2019-08-20 DIAGNOSIS — T148XXA Other injury of unspecified body region, initial encounter: Secondary | ICD-10-CM | POA: Diagnosis not present

## 2019-08-20 DIAGNOSIS — X58XXXA Exposure to other specified factors, initial encounter: Secondary | ICD-10-CM | POA: Diagnosis not present

## 2019-11-14 ENCOUNTER — Other Ambulatory Visit: Payer: Self-pay | Admitting: Medical

## 2019-11-17 DIAGNOSIS — Z6834 Body mass index (BMI) 34.0-34.9, adult: Secondary | ICD-10-CM | POA: Diagnosis not present

## 2019-11-17 DIAGNOSIS — T148XXA Other injury of unspecified body region, initial encounter: Secondary | ICD-10-CM | POA: Diagnosis not present

## 2019-12-03 DIAGNOSIS — Z6834 Body mass index (BMI) 34.0-34.9, adult: Secondary | ICD-10-CM | POA: Diagnosis not present

## 2019-12-03 DIAGNOSIS — T148XXA Other injury of unspecified body region, initial encounter: Secondary | ICD-10-CM | POA: Diagnosis not present

## 2020-03-18 ENCOUNTER — Telehealth: Payer: Self-pay | Admitting: Medical

## 2020-03-18 ENCOUNTER — Other Ambulatory Visit: Payer: Self-pay | Admitting: Medical

## 2020-03-18 DIAGNOSIS — F321 Major depressive disorder, single episode, moderate: Secondary | ICD-10-CM

## 2020-03-18 MED ORDER — OMEPRAZOLE 20 MG PO CPDR: 20.0000 mg | DELAYED_RELEASE_CAPSULE | Freq: Every day | ORAL | 0 refills | Status: DC

## 2020-03-18 MED ORDER — CITALOPRAM HYDROBROMIDE 20 MG PO TABS: 20.0000 mg | ORAL_TABLET | Freq: Every day | ORAL | 0 refills | Status: DC

## 2020-03-18 NOTE — Telephone Encounter (Signed)
Pt called and made a medcheck appt for 10/27. He no longer has insurance. He needs refills on Celexa and omeprazole until that appt. He uses [facility].

## 2020-10-27 DIAGNOSIS — Z6837 Body mass index (BMI) 37.0-37.9, adult: Secondary | ICD-10-CM | POA: Diagnosis not present

## 2020-10-27 DIAGNOSIS — R109 Unspecified abdominal pain: Secondary | ICD-10-CM | POA: Diagnosis not present

## 2020-10-27 DIAGNOSIS — L24A9 Irritant contact dermatitis due friction or contact with other specified body fluids: Secondary | ICD-10-CM | POA: Diagnosis not present

## 2020-10-27 DIAGNOSIS — R1033 Periumbilical pain: Secondary | ICD-10-CM | POA: Diagnosis not present

## 2020-11-16 DIAGNOSIS — T8189XA Other complications of procedures, not elsewhere classified, initial encounter: Secondary | ICD-10-CM | POA: Diagnosis not present

## 2020-11-16 DIAGNOSIS — M795 Residual foreign body in soft tissue: Secondary | ICD-10-CM | POA: Diagnosis not present

## 2020-11-22 ENCOUNTER — Encounter: Payer: Self-pay | Admitting: Medical

## 2020-11-22 ENCOUNTER — Ambulatory Visit (INDEPENDENT_AMBULATORY_CARE_PROVIDER_SITE_OTHER): Payer: BC Managed Care – PPO | Admitting: Medical

## 2020-11-22 ENCOUNTER — Other Ambulatory Visit: Payer: Self-pay

## 2020-11-22 VITALS — BP 150/86 | HR 83 | Ht 71.0 in | Wt 267.2 lb

## 2020-11-22 DIAGNOSIS — K219 Gastro-esophageal reflux disease without esophagitis: Secondary | ICD-10-CM

## 2020-11-22 DIAGNOSIS — E782 Mixed hyperlipidemia: Secondary | ICD-10-CM | POA: Diagnosis not present

## 2020-11-22 DIAGNOSIS — I10 Essential (primary) hypertension: Secondary | ICD-10-CM | POA: Insufficient documentation

## 2020-11-22 DIAGNOSIS — R0683 Snoring: Secondary | ICD-10-CM | POA: Diagnosis not present

## 2020-11-22 DIAGNOSIS — Z6837 Body mass index (BMI) 37.0-37.9, adult: Secondary | ICD-10-CM

## 2020-11-22 LAB — POCT URINALYSIS DIP (PROADVANTAGE DEVICE)
Blood, UA: NEGATIVE
Glucose, UA: 100 mg/dL — AB
Ketones, POC UA: NEGATIVE mg/dL
Leukocytes, UA: NEGATIVE
Nitrite, UA: NEGATIVE
Specific Gravity, Urine: 1.03
Urobilinogen, Ur: 0.2
pH, UA: 6 (ref 5.0–8.0)

## 2020-11-22 MED ORDER — OMEPRAZOLE 20 MG PO CPDR: 20.0000 mg | DELAYED_RELEASE_CAPSULE | Freq: Every day | ORAL | 2 refills | Status: DC

## 2020-11-22 MED ORDER — VALSARTAN-HYDROCHLOROTHIAZIDE 80-12.5 MG PO TABS: 1.0000 | ORAL_TABLET | Freq: Every day | ORAL | 1 refills | Status: DC

## 2020-11-22 NOTE — Progress Notes (Signed)
Subjective:  Brett Gonzalez is a 30 y.o. male who presents for Chief Complaint  Patient presents with   Blood Pressure Check     Here for concerns about Bp.  He notes in recent months has had some elevated BPs.  He went for preop recently and BP was up.   He has a cuff at home and home readings elevated.  Both parents have hypertension and father noted diagnosis about age 37.  Home readings 150/108 highest, average around 150 sbp, 92-93 DBP.     No chest discomfort, no SOB, no leg swelling.    He has had surgery for diverticulitis, then wound exploration for complication, prior colostomy.   His body rejected some of the sutures, and had other complications.  Had the wound exploration surgery last week, and that is when they noted his BP elevated.   Brett Gonzalez does not smoke.   Drinks small amount of alcohol  Snores.  Lives with girlfriend, no evidence of witnessed apnea.  Feels rested.   Sometimes can have daytime somnolence.   Family history: Father has hx/o HTN.    No family history of sleep apnea or heart disease  No other aggravating or relieving factors.    No other c/o.   Past Medical History:  Diagnosis Date   ADD (attention deficit disorder)    Asthma    worse in childhood   Diverticulitis 2020   GERD (gastroesophageal reflux disease)    Wears contact lenses     Family History  Problem Relation Age of Onset   Arthritis Mother    Diabetes Mother    Gallbladder disease Mother    GER disease Mother    Diverticulitis Mother    Hypertension Father    Arthritis Father    Anxiety disorder Father    Cancer Maternal Grandfather    Stroke Paternal Grandfather    Heart disease Neg Hx     The following portions of the patient's history were reviewed and updated as appropriate: allergies, current medications, past family history, past medical history, past social history, past surgical history and problem list.  ROS Otherwise as in subjective  above    Objective: BP (!) 150/86   Pulse 83   Ht 5\' 11"  (1.803 m)   Wt 267 lb 3.2 oz (121.2 kg)   SpO2 96%   BMI 37.27 kg/m   Wt Readings from Last 3 Encounters:  11/22/20 267 lb 3.2 oz (121.2 kg)  02/04/19 238 lb (108 kg)  11/18/18 252 lb 3.2 oz (114.4 kg)    BP Readings from Last 3 Encounters:  11/22/20 (!) 150/86  11/18/18 110/80  01/25/18 138/82   General appearance: alert, no distress, well developed, well nourished, white male Neck: supple, no lymphadenopathy, no thyromegaly, no masses, no bruits Heart: RRR, normal S1, S2, no murmurs Lungs: CTA bilaterally, no wheezes, rhonchi, or rales Pulses: 2+ radial pulses, 2+ pedal pulses, normal cap refill Ext: no edema Neuro: cn2-12 intact, nonfocal exam    Adult ECG Report  Indication: HTN  Rate: 72 bpm  Rhythm: normal sinus rhythm  QRS Axis: 44 degrees  PR Interval: 01/27/18  QRS Duration: 55ms  QTc: 80m  Conduction Disturbances: none  Other Abnormalities: none  Patient's cardiac risk factors are: hypertension, male gender, and obesity (BMI >= 30 kg/m2).  EKG comparison: none  Narrative Interpretation: normal other than unusual glitch during EKG, giving faulty reading in V2.   Despite several efforts by the CMA to repeat EKG, unable to  get any other tracing today unfortunately.   19" neck circumference measured by me  Assessment: Encounter Diagnoses  Name Primary?   Essential hypertension, benign Yes   BMI 37.0-37.9, adult    Snoring    Mixed dyslipidemia    Gastroesophageal reflux disease, unspecified whether esophagitis present      Plan: Hypertension - Discussed diagnosis, possible complications, treatment recommendations including regular aerobic exercise, healthy low salt, low fat diet, dietary sodium restriction, working towards a health weight, and need for medications.  Begin medication Valsartan HCT below for BP.  Discussed risks/benefits of medication.  Evidence of target organ damage: none.     Check blood pressures 2-3 days per week and record.  Follow up in 4-6 weeks.   BMI greater than 37-discussed the need to lose weight through healthy diet and exercise  Snoring-consider sleep study.  Given his neck diameter and small oral airway, there is a good chance he could have sleep apnea  Mixed dyslipidemia from 2020-advised upcoming fasting lipid panel.  He is nonfasting today.  GERD - he requested refill and does fine on omeprazole    Brett Gonzalez was seen today for blood pressure check.  Diagnoses and all orders for this visit:  Essential hypertension, benign -     EKG 12-Lead -     POCT Urinalysis DIP (Proadvantage Device)  BMI 37.0-37.9, adult -     EKG 12-Lead -     POCT Urinalysis DIP (Proadvantage Device)  Snoring -     EKG 12-Lead -     POCT Urinalysis DIP (Proadvantage Device)  Mixed dyslipidemia  Gastroesophageal reflux disease, unspecified whether esophagitis present  Other orders -     omeprazole (PRILOSEC) 20 MG capsule; Take 1 capsule (20 mg total) by mouth daily. -     valsartan-hydrochlorothiazide (DIOVAN-HCT) 80-12.5 MG tablet; Take 1 tablet by mouth daily.   Follow up: 79mo

## 2020-11-22 NOTE — Patient Instructions (Signed)
High blood pressure/hypertension I recommend you begin medication I recommend you check your blood pressures at home periodically at rest.  The goal blood pressure is 120/70.  Borderline blood pressure is anything greater than 130/80.  If your blood pressures are consistently running borderline or higher, then let me know or follow-up. Limit salt intake as this can cause you to retain fluid and elevate blood pressures. Limit or avoid anti-inflammatories such as ibuprofen, Aleve, Motrin, Advil as these can elevate the blood pressure with chronic use Limit stress or cut down on stress where possible Try to get 7 to 8 hours of sleep nightly.  If there are any concerns of sleep apnea such as loud snoring, witnessed apnea events, significant fatigue, daytime sleepiness, then let me know as you may need to be evaluated for sleep apnea. It is important to maintain good blood pressure control to prevent heart events such as heart failure, heart attack, stroke, decreased heart function and also to limit kidney damage      Hypertension, Adult Hypertension is another name for high blood pressure. High blood pressure forces your heart to work harder to pump blood. This can cause problems overtime. There are two numbers in a blood pressure reading. There is a top number (systolic) over a bottom number (diastolic). It is best to have a blood pressure that is below 120/80. Healthy choicescan help lower your blood pressure, or you may need medicine to help lower it. What are the causes? The cause of this condition is not known. Some conditions may be related tohigh blood pressure. What increases the risk? Smoking. Having type 2 diabetes mellitus, high cholesterol, or both. Not getting enough exercise or physical activity. Being overweight. Having too much fat, sugar, calories, or salt (sodium) in your diet. Drinking too much alcohol. Having long-term (chronic) kidney disease. Having a family history of high  blood pressure. Age. Risk increases with age. Race. You may be at higher risk if you are African American. Gender. Men are at higher risk than women before age 61. After age 25, women are at higher risk than men. Having obstructive sleep apnea. Stress. What are the signs or symptoms? High blood pressure may not cause symptoms. Very high blood pressure (hypertensive crisis) may cause: Headache. Feelings of worry or nervousness (anxiety). Shortness of breath. Nosebleed. A feeling of being sick to your stomach (nausea). Throwing up (vomiting). Changes in how you see. Very bad chest pain. Seizures. How is this treated? This condition is treated by making healthy lifestyle changes, such as: Eating healthy foods. Exercising more. Drinking less alcohol. Your health care provider may prescribe medicine if lifestyle changes are not enough to get your blood pressure under control, and if: Your top number is above 130. Your bottom number is above 80. Your personal target blood pressure may vary. Follow these instructions at home: Eating and drinking  If told, follow the DASH eating plan. To follow this plan: Fill one half of your plate at each meal with fruits and vegetables. Fill one fourth of your plate at each meal with whole grains. Whole grains include whole-wheat pasta, brown rice, and whole-grain bread. Eat or drink low-fat dairy products, such as skim milk or low-fat yogurt. Fill one fourth of your plate at each meal with low-fat (lean) proteins. Low-fat proteins include fish, chicken without skin, eggs, beans, and tofu. Avoid fatty meat, cured and processed meat, or chicken with skin. Avoid pre-made or processed food. Eat less than 1,500 mg of salt each  day. Do not drink alcohol if: Your doctor tells you not to drink. You are pregnant, may be pregnant, or are planning to become pregnant. If you drink alcohol: Limit how much you use to: 0-1 drink a day for women. 0-2 drinks a  day for men. Be aware of how much alcohol is in your drink. In the [place] one drink equals one 12 oz bottle of beer (355 mL), one 5 oz glass of wine (148 mL), or one 1 oz glass of hard liquor (44 mL).  Lifestyle  Work with your doctor to stay at a healthy weight or to lose weight. Ask your doctor what the best weight is for you. Get at least 30 minutes of exercise most days of the week. This may include walking, swimming, or biking. Get at least 30 minutes of exercise that strengthens your muscles (resistance exercise) at least 3 days a week. This may include lifting weights or doing Pilates. Do not use any products that contain nicotine or tobacco, such as cigarettes, e-cigarettes, and chewing tobacco. If you need help quitting, ask your doctor. Check your blood pressure at home as told by your doctor. Keep all follow-up visits as told by your doctor. This is important.  Medicines Take over-the-counter and prescription medicines only as told by your doctor. Follow directions carefully. Do not skip doses of blood pressure medicine. The medicine does not work as well if you skip doses. Skipping doses also puts you at risk for problems. Ask your doctor about side effects or reactions to medicines that you should watch for. Contact a doctor if you: Think you are having a reaction to the medicine you are taking. Have headaches that keep coming back (recurring). Feel dizzy. Have swelling in your ankles. Have trouble with your vision. Get help right away if you: Get a very bad headache. Start to feel mixed up (confused). Feel weak or numb. Feel faint. Have very bad pain in your: Chest. Belly (abdomen). Throw up more than once. Have trouble breathing. Summary Hypertension is another name for high blood pressure. High blood pressure forces your heart to work harder to pump blood. For most people, a normal blood pressure is less than 120/80. Making healthy choices can help lower blood  pressure. If your blood pressure does not get lower with healthy choices, you may need to take medicine. This information is not intended to replace advice given to you by your health care provider. Make sure you discuss any questions you have with your healthcare provider. Document Revised: 01/30/2018 Document Reviewed: 01/30/2018 Elsevier Patient Education  2022 [employer].

## 2020-11-30 DIAGNOSIS — S31109A Unspecified open wound of abdominal wall, unspecified quadrant without penetration into peritoneal cavity, initial encounter: Secondary | ICD-10-CM | POA: Diagnosis not present

## 2020-11-30 DIAGNOSIS — T8189XA Other complications of procedures, not elsewhere classified, initial encounter: Secondary | ICD-10-CM | POA: Diagnosis not present

## 2020-12-20 ENCOUNTER — Ambulatory Visit: Payer: BC Managed Care – PPO | Admitting: Medical

## 2020-12-20 DIAGNOSIS — Z0000 Encounter for general adult medical examination without abnormal findings: Secondary | ICD-10-CM

## 2020-12-21 ENCOUNTER — Telehealth: Payer: Self-pay | Admitting: Medical

## 2020-12-21 DIAGNOSIS — T8189XD Other complications of procedures, not elsewhere classified, subsequent encounter: Secondary | ICD-10-CM | POA: Diagnosis not present

## 2020-12-21 DIAGNOSIS — K5720 Diverticulitis of large intestine with perforation and abscess without bleeding: Secondary | ICD-10-CM | POA: Diagnosis not present

## 2020-12-21 NOTE — Telephone Encounter (Signed)

## 2020-12-27 ENCOUNTER — Encounter: Payer: Self-pay | Admitting: Medical

## 2021-01-18 ENCOUNTER — Other Ambulatory Visit: Payer: Self-pay | Admitting: Medical

## 2021-01-18 DIAGNOSIS — F321 Major depressive disorder, single episode, moderate: Secondary | ICD-10-CM

## 2021-02-21 ENCOUNTER — Other Ambulatory Visit: Payer: Self-pay | Admitting: Medical

## 2021-03-06 DIAGNOSIS — R1032 Left lower quadrant pain: Secondary | ICD-10-CM | POA: Diagnosis not present

## 2021-03-06 DIAGNOSIS — N201 Calculus of ureter: Secondary | ICD-10-CM | POA: Diagnosis not present

## 2021-04-12 DIAGNOSIS — Z20822 Contact with and (suspected) exposure to covid-19: Secondary | ICD-10-CM | POA: Diagnosis not present

## 2021-04-12 DIAGNOSIS — M791 Myalgia, unspecified site: Secondary | ICD-10-CM | POA: Diagnosis not present

## 2021-04-12 DIAGNOSIS — J0100 Acute maxillary sinusitis, unspecified: Secondary | ICD-10-CM | POA: Diagnosis not present

## 2021-04-12 DIAGNOSIS — H6693 Otitis media, unspecified, bilateral: Secondary | ICD-10-CM | POA: Diagnosis not present

## 2021-06-29 ENCOUNTER — Other Ambulatory Visit: Payer: Self-pay | Admitting: Medical

## 2021-06-29 DIAGNOSIS — F321 Major depressive disorder, single episode, moderate: Secondary | ICD-10-CM

## 2021-07-06 DIAGNOSIS — Z6839 Body mass index (BMI) 39.0-39.9, adult: Secondary | ICD-10-CM | POA: Diagnosis not present

## 2021-07-06 DIAGNOSIS — M6208 Separation of muscle (nontraumatic), other site: Secondary | ICD-10-CM | POA: Diagnosis not present

## 2021-08-12 ENCOUNTER — Other Ambulatory Visit: Payer: Self-pay | Admitting: Medical

## 2021-08-12 DIAGNOSIS — F321 Major depressive disorder, single episode, moderate: Secondary | ICD-10-CM

## 2021-08-15 NOTE — Telephone Encounter (Signed)
Left detailed message for pt to call back to schedule and once scheduled we can refill med for 30 days ?

## 2021-11-21 ENCOUNTER — Encounter: Payer: Self-pay | Admitting: Medical

## 2021-11-21 ENCOUNTER — Ambulatory Visit: Payer: BC Managed Care – PPO | Admitting: Medical

## 2021-11-21 VITALS — BP 120/72 | HR 100 | Temp 98.2°F | Wt 285.6 lb

## 2021-11-21 DIAGNOSIS — F321 Major depressive disorder, single episode, moderate: Secondary | ICD-10-CM

## 2021-11-21 DIAGNOSIS — I10 Essential (primary) hypertension: Secondary | ICD-10-CM

## 2021-11-21 DIAGNOSIS — Z8719 Personal history of other diseases of the digestive system: Secondary | ICD-10-CM | POA: Diagnosis not present

## 2021-11-21 DIAGNOSIS — K219 Gastro-esophageal reflux disease without esophagitis: Secondary | ICD-10-CM | POA: Diagnosis not present

## 2021-11-21 DIAGNOSIS — Z6839 Body mass index (BMI) 39.0-39.9, adult: Secondary | ICD-10-CM

## 2021-11-21 DIAGNOSIS — F325 Major depressive disorder, single episode, in full remission: Secondary | ICD-10-CM

## 2021-11-21 MED ORDER — OMEPRAZOLE 20 MG PO CPDR: DELAYED_RELEASE_CAPSULE | ORAL | 3 refills | Status: DC

## 2021-11-21 MED ORDER — VALSARTAN-HYDROCHLOROTHIAZIDE 80-12.5 MG PO TABS
1.0000 | ORAL_TABLET | Freq: Every day | ORAL | 1 refills | Status: DC
Start: 2021-11-21 — End: 2022-02-09

## 2021-11-21 MED ORDER — CITALOPRAM HYDROBROMIDE 20 MG PO TABS: 20.0000 mg | ORAL_TABLET | Freq: Every day | ORAL | 3 refills | Status: DC

## 2021-11-21 NOTE — Progress Notes (Signed)
Subjective:  Brett Gonzalez is a 31 y.o. male who presents for Chief Complaint  Patient presents with   other    Med check no other issues at this time     Here for med check.  Last visit about a year ago.  He has been rationing his medications as he was past due for recheck.  He is not checking blood pressures at home.  He decided to start a diet today.  He is planning to cut out fried foods and soda, trying to drink more water, trying to be better to lose weight.  He is gaining weight since last visit.  He plans to exercise regularly.  He is nonfasting today.  His goal weight initially to get 240 pounds.  History of childhood asthma.  No recent problems breathing.  No other aggravating or relieving factors.    No other c/o.  Past Medical History:  Diagnosis Date   ADD (attention deficit disorder)    Asthma    worse in childhood   Diverticulitis 2020   GERD (gastroesophageal reflux disease)    Wears contact lenses    No current outpatient medications on file prior to visit.   No current facility-administered medications on file prior to visit.     The following portions of the patient's history were reviewed and updated as appropriate: allergies, current medications, past family history, past medical history, past social history, past surgical history and problem list.  ROS Otherwise as in subjective above  Objective: BP 120/72   Pulse 100   Temp 98.2 F (36.8 C)   Wt 285 lb 9.6 oz (129.5 kg)   BMI 39.83 kg/m   General appearance: alert, no distress, well developed, well nourished Neck: supple, no lymphadenopathy, no thyromegaly, no masses Heart: RRR, normal S1, S2, no murmurs Lungs: CTA bilaterally, no wheezes, rhonchi, or rales Pulses: 2+ radial pulses, 2+ pedal pulses, normal cap refill Ext: no edema   Assessment: Encounter Diagnoses  Name Primary?   Essential hypertension, benign Yes   Depression, major, single episode, moderate (HCC)     Gastroesophageal reflux disease, unspecified whether esophagitis present    History of diverticulitis    Depression, major, in remission (HCC)    BMI 39.0-39.9,adult      Plan: I congratulated him on his recent engagement  Continue current medications, refills given today.  Hypertension-continue current medication  History of depression-continue current medication  GERD-continue current medication  Weight gain, BMI 39-continue efforts to lose weight through healthy diet and exercise.  He notes having a kidney stone back in the fall 2022.  Was seen in emergency department for this.  Follow-up in 3 months for fasting physical including labs for thyroid, diabetes screening, lipid profile and other.  Brett Gonzalez was seen today for other.  Diagnoses and all orders for this visit:  Essential hypertension, benign  Depression, major, single episode, moderate (HCC) -     citalopram (CELEXA) 20 MG tablet; Take 1 tablet (20 mg total) by mouth daily.  Gastroesophageal reflux disease, unspecified whether esophagitis present  History of diverticulitis  Depression, major, in remission (HCC)  BMI 39.0-39.9,adult  Other orders -     valsartan-hydrochlorothiazide (DIOVAN-HCT) 80-12.5 MG tablet; Take 1 tablet by mouth daily. -     omeprazole (PRILOSEC) 20 MG capsule; TAKE (1) CAPSULE BY MOUTH ONCE DAILY.    Follow up: 64mo for fasting physical

## 2022-02-07 ENCOUNTER — Ambulatory Visit (INDEPENDENT_AMBULATORY_CARE_PROVIDER_SITE_OTHER): Payer: BC Managed Care – PPO | Admitting: Medical

## 2022-02-07 ENCOUNTER — Encounter: Payer: Self-pay | Admitting: Medical

## 2022-02-07 VITALS — BP 120/80 | HR 64 | Ht 70.25 in | Wt 283.8 lb

## 2022-02-07 DIAGNOSIS — Z6841 Body Mass Index (BMI) 40.0 and over, adult: Secondary | ICD-10-CM

## 2022-02-07 DIAGNOSIS — Z720 Tobacco use: Secondary | ICD-10-CM

## 2022-02-07 DIAGNOSIS — I10 Essential (primary) hypertension: Secondary | ICD-10-CM | POA: Diagnosis not present

## 2022-02-07 DIAGNOSIS — Z8719 Personal history of other diseases of the digestive system: Secondary | ICD-10-CM

## 2022-02-07 DIAGNOSIS — R0602 Shortness of breath: Secondary | ICD-10-CM

## 2022-02-07 DIAGNOSIS — R0789 Other chest pain: Secondary | ICD-10-CM

## 2022-02-07 DIAGNOSIS — R5383 Other fatigue: Secondary | ICD-10-CM | POA: Diagnosis not present

## 2022-02-07 DIAGNOSIS — Z0000 Encounter for general adult medical examination without abnormal findings: Secondary | ICD-10-CM

## 2022-02-07 DIAGNOSIS — R251 Tremor, unspecified: Secondary | ICD-10-CM

## 2022-02-07 DIAGNOSIS — K219 Gastro-esophageal reflux disease without esophagitis: Secondary | ICD-10-CM | POA: Diagnosis not present

## 2022-02-07 DIAGNOSIS — Z131 Encounter for screening for diabetes mellitus: Secondary | ICD-10-CM

## 2022-02-07 LAB — POCT URINALYSIS DIP (PROADVANTAGE DEVICE)
Bilirubin, UA: NEGATIVE
Blood, UA: NEGATIVE
Glucose, UA: NEGATIVE mg/dL
Ketones, POC UA: NEGATIVE mg/dL
Leukocytes, UA: NEGATIVE
Nitrite, UA: NEGATIVE
Protein Ur, POC: NEGATIVE mg/dL
Specific Gravity, Urine: 1.025
Urobilinogen, Ur: NEGATIVE
pH, UA: 6 (ref 5.0–8.0)

## 2022-02-07 NOTE — Progress Notes (Signed)
Subjective:   HPI  Brett Gonzalez is a 31 y.o. male who presents for Chief Complaint  Patient presents with   fasting cpe,    Fasting cpe, shaking in right hand and sometimes watching TV resting will get SOB- not sure if its the blood pressure medication, declines flu shot    Patient Care Team: Fletcher Rathbun, Cleda Mccreedy as PCP - General (Family Medicine) Sees dentist Sees eye doctor  Concerns: He notes getting 2 recent covid vaccines.  Exercise - active on the job with [employer].  Since last visit has worked on cutting down portion sizes, not eating after 7pm, trying to avoid junk food, drinking more water.  Having some random fatigue, unmotivated at times.  Right hand shakes sometimes.  Noticed it the other day after weed eating.    Gets occasional chest pain.  Thought it could be gas.  None in past 2 weeks, but has had it 4 times since last visit.  When it occurs, lasts less than 5 minutes, sharp and tightness in chest.    Sometimes feels out of breath, can be just sitting watching tv.   Had 1 dizzy spell brief last week getting up to get groceries.   At times can feel sweaty, hot flushed feeling.  Has had some headache of late.  Headaches typically right frontal.  Some snoring.  No witnessed apnea, no daytime somnolence.  Reviewed their medical, surgical, family, social, medication, and allergy history and updated chart as appropriate.  Past Medical History:  Diagnosis Date   ADD (attention deficit disorder)    Asthma    worse in childhood   Diverticulitis 2020   GERD (gastroesophageal reflux disease)    Hypertension    Wears contact lenses     Past Surgical History:  Procedure Laterality Date   I & D EXTREMITY Right 04/11/2014   Procedure: IRRIGATION AND DEBRIDEMENT RIGHT HAND AND MIDDLE FINGER WITH ;  Surgeon: Dominica Severin, MD;  Location: [facility];  Service: Orthopedics;  Laterality: Right;   REPAIR EXTENSOR TENDON Right 04/11/2014   Procedure: EXTENSOR  TENDON REPAIR AND RECONSTRUCTION ;  Surgeon: Dominica Severin, MD;  Location: [facility];  Service: Orthopedics;  Laterality: Right;   ROTATOR CUFF REPAIR Right    TONSILLECTOMY     VASECTOMY      Family History  Problem Relation Age of Onset   Arthritis Mother    Diabetes Mother    Gallbladder disease Mother    GER disease Mother    Diverticulitis Mother    Hypertension Father    Arthritis Father    Anxiety disorder Father    Cancer Maternal Grandfather    Stroke Paternal Grandfather    Heart disease Neg Hx      Current Outpatient Medications:    citalopram (CELEXA) 20 MG tablet, Take 1 tablet (20 mg total) by mouth daily., Disp: 90 tablet, Rfl: 3   omeprazole (PRILOSEC) 20 MG capsule, TAKE (1) CAPSULE BY MOUTH ONCE DAILY., Disp: 90 capsule, Rfl: 3   valsartan-hydrochlorothiazide (DIOVAN-HCT) 80-12.5 MG tablet, Take 1 tablet by mouth daily., Disp: 90 tablet, Rfl: 1  Allergies  Allergen Reactions   Codeine Swelling     Review of Systems Constitutional: -fever, -chills, -sweats, -unexpected weight change, -decreased appetite, +fatigue Allergy: -sneezing, -itching, -congestion Dermatology: -changing moles, --rash, -lumps ENT: -runny nose, -ear pain, -sore throat, -hoarseness, -sinus pain, -teeth pain, - ringing in ears, -hearing loss, -nosebleeds Cardiology: +chest pain, -palpitations, -swelling, -difficulty breathing when lying flat, -  waking up short of breath Respiratory: -cough, +shortness of breath, -difficulty breathing with exercise or exertion, -wheezing, -coughing up blood Gastroenterology: -abdominal pain, -nausea, -vomiting, -diarrhea, -constipation, -blood in stool, -changes in bowel movement, -difficulty swallowing or eating Hematology: -bleeding, -bruising  Musculoskeletal: -joint aches, -muscle aches, -joint swelling, -back pain, -neck pain, -cramping, -changes in gait Ophthalmology: denies vision changes, eye redness, itching, discharge Urology: -burning with  urination, -difficulty urinating, -blood in urine, -urinary frequency, -urgency, -incontinence Neurology: -headache, -weakness, -tingling, -numbness, -memory loss, -falls, -dizziness Psychology: -depressed mood, -agitation, -sleep problems Male GU: no testicular mass, pain, no lymph nodes swollen, no swelling, no rash.     02/07/2022   11:12 AM 11/22/2020    1:38 PM 11/18/2018   10:38 AM 12/21/2016    3:08 PM  Depression screen PHQ 2/9  Decreased Interest 0 0 0 2  Down, Depressed, Hopeless 0 0 0 3  PHQ - 2 Score 0 0 0 5  Altered sleeping 0 0  3  Tired, decreased energy 0 0  3  Change in appetite 0 0  3  Feeling bad or failure about yourself  0 0  3  Trouble concentrating 0 0  0  Moving slowly or fidgety/restless 0 0  1  Suicidal thoughts 0   3  PHQ-9 Score 0 0  21  Difficult doing work/chores Not difficult at all   Somewhat difficult        Objective:  BP 120/80   Pulse 64   Ht 5' 10.25" (1.784 m)   Wt 283 lb 12.8 oz (128.7 kg)   BMI 40.43 kg/m   Wt Readings from Last 3 Encounters:  02/07/22 283 lb 12.8 oz (128.7 kg)  11/21/21 285 lb 9.6 oz (129.5 kg)  11/22/20 267 lb 3.2 oz (121.2 kg)   General appearance: alert, no distress, WD/WN, Caucasian male Skin: unremarkable HEENT: normocephalic, conjunctiva/corneas normal, sclerae anicteric, PERRLA, EOMi, nares patent, no discharge or erythema, pharynx normal Oral cavity: MMM, tongue normal, teeth normal Neck: supple, no lymphadenopathy, no thyromegaly, no masses, normal ROM, no bruits Chest: non tender, normal shape and expansion Heart: RRR, normal S1, S2, no murmurs Lungs: CTA bilaterally, no wheezes, rhonchi, or rales Abdomen: +bs, soft, large linear vertical and left horizontal surgical scars, non tender, non distended, no masses, no hepatomegaly, no splenomegaly, no bruits Back: non tender, normal ROM, no scoliosis Musculoskeletal: upper extremities non tender, no obvious deformity, normal ROM throughout, lower extremities  non tender, no obvious deformity, normal ROM throughout Extremities: no edema, no cyanosis, no clubbing Pulses: 2+ symmetric, upper and lower extremities, normal cap refill Neurological:  +mild right hand physiologic tremor, otherwise alert, oriented x 3, CN2-12 intact, strength normal upper extremities and lower extremities, sensation normal throughout, DTRs 2+ throughout, no cerebellar signs, gait normal Psychiatric: normal affect, behavior normal, pleasant  GU: normal male external genitalia,circumcised, nontender, no masses, no hernia, no lymphadenopathy Rectal: deferred  EKG reviewed   Assessment and Plan :   Encounter Diagnoses  Name Primary?   Encounter for health maintenance examination in adult Yes   Essential hypertension, benign    Chewing tobacco use    Gastroesophageal reflux disease, unspecified whether esophagitis present    Screening for diabetes mellitus    History of diverticulitis    BMI 40.0-44.9, adult (HCC)    Shakes    SOB (shortness of breath)    Chest discomfort    Other fatigue     This visit was a preventative care visit, also known  as wellness visit or routine physical.   Topics typically include healthy lifestyle, diet, exercise, preventative care, vaccinations, sick and well care, proper use of emergency dept and after hours care, as well as other concerns.     Recommendations: Continue to return yearly for your annual wellness and preventative care visits.  This gives [place] a chance to discuss healthy lifestyle, exercise, vaccinations, review your chart record, and perform screenings where appropriate.  I recommend you see your eye doctor yearly for routine vision care.  I recommend you see your dentist yearly for routine dental care including hygiene visits twice yearly.   Vaccination recommendations were reviewed Immunization History  Administered Date(s) Administered   Influenza Split 04/03/2013   Influenza,inj,Quad PF,6+ Mos 02/09/2017    Moderna Sars-Covid-2 Vaccination 09/30/2020, 10/28/2020   Tdap 01/11/2018    He declines flu vaccine   Screening for cancer: Colon cancer screening: Age 80  Testicular cancer screening You should do a monthly self testicular exam if you are between 67-41 years old  We discussed PSA, prostate exam, and prostate cancer screening risks/benefits.     Skin cancer screening: Check your skin regularly for new changes, growing lesions, or other lesions of concern Come in for evaluation if you have skin lesions of concern.  Lung cancer screening: If you have a greater than 20 pack year history of tobacco use, then you may qualify for lung cancer screening with a chest CT scan.   Please call your insurance company to inquire about coverage for this test.  We currently don't have screenings for other cancers besides breast, cervical, colon, and lung cancers.  If you have a strong family history of cancer or have other cancer screening concerns, please let me know.    Bone health: Get at least 150 minutes of aerobic exercise weekly Get weight bearing exercise at least once weekly Bone density test:  A bone density test is an imaging test that uses a type of X-ray to measure the amount of calcium and other minerals in your bones. The test may be used to diagnose or screen you for a condition that causes weak or thin bones (osteoporosis), predict your risk for a broken bone (fracture), or determine how well your osteoporosis treatment is working. The bone density test is recommended for females 65 and older, or females or males <65 if certain risk factors such as thyroid disease, long term use of steroids such as for asthma or rheumatological issues, vitamin D deficiency, estrogen deficiency, family history of osteoporosis, self or family history of fragility fracture in first degree relative.    Heart health: Get at least 150 minutes of aerobic exercise weekly Limit alcohol It is important  to maintain a healthy blood pressure and healthy cholesterol numbers  Heart disease screening: Screening for heart disease includes screening for blood pressure, fasting lipids, glucose/diabetes screening, BMI height to weight ratio, reviewed of smoking status, physical activity, and diet.    Goals include blood pressure 120/80 or less, maintaining a healthy lipid/cholesterol profile, preventing diabetes or keeping diabetes numbers under good control, not smoking or using tobacco products, exercising most days per week or at least 150 minutes per week of exercise, and eating healthy variety of fruits and vegetables, healthy oils, and avoiding unhealthy food choices like fried food, fast food, high sugar and high cholesterol foods.    Other tests may possibly include EKG test, CT coronary calcium score, echocardiogram, exercise treadmill stress test.     Medical care options: I  recommend you continue to seek care here first for routine care.  We try really hard to have available appointments Monday through Friday daytime hours for sick visits, acute visits, and physicals.  Urgent care should be used for after hours and weekends for significant issues that cannot wait till the next day.  The emergency department should be used for significant potentially life-threatening emergencies.  The emergency department is expensive, can often have long wait times for less significant concerns, so try to utilize primary care, urgent care, or telemedicine when possible to avoid unnecessary trips to the emergency department.  Virtual visits and telemedicine have been introduced since the pandemic started in 2020, and can be convenient ways to receive medical care.  We offer virtual appointments as well to assist you in a variety of options to seek medical care.   Advanced Directives: I recommend you consider completing a Health Care Power of Attorney and Living Will.   These documents respect your wishes and help  alleviate burdens on your loved ones if you were to become terminally ill or be in a position to need those documents enforced.    You can complete Advanced Directives yourself, have them notarized, then have copies made for our office, for you and for anybody you feel should have them in safe keeping.  Or, you can have an attorney prepare these documents.   If you haven't updated your Last Will and Testament in a while, it may be worthwhile having an attorney prepare these documents together and save on some costs.       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  Counseled on caffeine use.  Updated labs today  We discussed his chest discomfort and shortness of breath and fatigue-this could be due to deconditioning and obesity.  Labs today, consider cardiac testing depending on results  Fatigue-we discussed possible causes.  Updated labs today including testosterone and thyroid levels   Jenson was seen today for fasting cpe,.  Diagnoses and all orders for this visit:  Encounter for health maintenance examination in adult -     Comprehensive metabolic panel -     CBC -     Lipid panel -     Hemoglobin A1c -     TSH -     Hepatitis C antibody -     Testosterone -     EKG 12-Lead -     Magnesium -     POCT Urinalysis DIP (Proadvantage Device)  Essential hypertension, benign -     EKG 12-Lead -     POCT Urinalysis DIP (Proadvantage Device)  Chewing tobacco use  Gastroesophageal reflux disease, unspecified whether esophagitis present  Screening for diabetes mellitus  History of diverticulitis  BMI 40.0-44.9, adult (HCC)  Shakes  SOB (shortness of breath) -     CBC -      TSH  Chest discomfort -     EKG 12-Lead  Other fatigue -     TSH -     Testosterone    Follow-up pending labs, yearly for physical

## 2022-02-08 ENCOUNTER — Other Ambulatory Visit: Payer: Self-pay | Admitting: Medical

## 2022-02-08 DIAGNOSIS — R0789 Other chest pain: Secondary | ICD-10-CM

## 2022-02-08 DIAGNOSIS — R0602 Shortness of breath: Secondary | ICD-10-CM

## 2022-02-08 LAB — LIPID PANEL
Chol/HDL Ratio: 10 ratio — ABNORMAL HIGH (ref 0.0–5.0)
Cholesterol, Total: 261 mg/dL — ABNORMAL HIGH (ref 100–199)
HDL: 26 mg/dL — ABNORMAL LOW (ref >39)
LDL Chol Calc (NIH): 144 mg/dL — ABNORMAL HIGH (ref 0–99)
Triglycerides: 484 mg/dL — ABNORMAL HIGH (ref 0–149)
VLDL Cholesterol Cal: 91 mg/dL — ABNORMAL HIGH (ref 5–40)

## 2022-02-08 LAB — CBC
Hematocrit: 47.5 % (ref 37.5–51.0)
Hemoglobin: 16 g/dL (ref 13.0–17.7)
MCH: 30.3 pg (ref 26.6–33.0)
MCHC: 33.7 g/dL (ref 31.5–35.7)
MCV: 90 fL (ref 79–97)
Platelets: 261 10*3/uL (ref 150–450)
RBC: 5.28 x10E6/uL (ref 4.14–5.80)
RDW: 13.1 % (ref 11.6–15.4)
WBC: 6.1 10*3/uL (ref 3.4–10.8)

## 2022-02-08 LAB — HEMOGLOBIN A1C
Est. average glucose Bld gHb Est-mCnc: 134 mg/dL
Hgb A1c MFr Bld: 6.3 % — ABNORMAL HIGH (ref 4.8–5.6)

## 2022-02-08 LAB — COMPREHENSIVE METABOLIC PANEL
ALT: 66 IU/L — ABNORMAL HIGH (ref 0–44)
AST: 37 IU/L (ref 0–40)
Albumin/Globulin Ratio: 1.7 (ref 1.2–2.2)
Albumin: 4.7 g/dL (ref 4.1–5.1)
Alkaline Phosphatase: 109 IU/L (ref 44–121)
BUN/Creatinine Ratio: 9 (ref 9–20)
BUN: 9 mg/dL (ref 6–20)
Bilirubin Total: 0.6 mg/dL (ref 0.0–1.2)
CO2: 21 mmol/L (ref 20–29)
Calcium: 10.4 mg/dL — ABNORMAL HIGH (ref 8.7–10.2)
Chloride: 102 mmol/L (ref 96–106)
Creatinine, Ser: 1.03 mg/dL (ref 0.76–1.27)
Globulin, Total: 2.8 g/dL (ref 1.5–4.5)
Glucose: 117 mg/dL — ABNORMAL HIGH (ref 70–99)
Potassium: 4.3 mmol/L (ref 3.5–5.2)
Sodium: 140 mmol/L (ref 134–144)
Total Protein: 7.5 g/dL (ref 6.0–8.5)
eGFR: 99 mL/min/{1.73_m2} (ref >59)

## 2022-02-08 LAB — MAGNESIUM: Magnesium: 2 mg/dL (ref 1.6–2.3)

## 2022-02-08 LAB — TSH: TSH: 1.98 u[IU]/mL (ref 0.450–4.500)

## 2022-02-08 LAB — TESTOSTERONE: Testosterone: 305 ng/dL (ref 264–916)

## 2022-02-08 LAB — HEPATITIS C ANTIBODY: Hep C Virus Ab: NONREACTIVE

## 2022-02-09 ENCOUNTER — Other Ambulatory Visit: Payer: Self-pay | Admitting: Medical

## 2022-02-09 MED ORDER — ROSUVASTATIN CALCIUM 10 MG PO TABS: 10.0000 mg | ORAL_TABLET | Freq: Every day | ORAL | 3 refills | Status: DC

## 2022-02-09 MED ORDER — WEGOVY 0.25 MG/0.5ML ~~LOC~~ SOAJ: 0.2500 mg | SUBCUTANEOUS | 0 refills | Status: DC

## 2022-02-09 MED ORDER — VALSARTAN-HYDROCHLOROTHIAZIDE 80-12.5 MG PO TABS: 1.0000 | ORAL_TABLET | Freq: Every day | ORAL | 3 refills | Status: DC

## 2022-03-08 ENCOUNTER — Encounter: Payer: Self-pay | Admitting: Internal Medicine

## 2022-03-08 ENCOUNTER — Ambulatory Visit: Payer: BC Managed Care – PPO | Attending: Internal Medicine | Admitting: Internal Medicine

## 2022-03-08 VITALS — BP 118/78 | HR 92 | Ht 71.0 in | Wt 281.8 lb

## 2022-03-08 DIAGNOSIS — R079 Chest pain, unspecified: Secondary | ICD-10-CM | POA: Diagnosis not present

## 2022-03-08 NOTE — Progress Notes (Signed)
Cardiology Office Note:    Date:  03/08/2022   ID:  Brett Gonzalez, DOB 1990/11/09, MRN [id number]  PCP:  Caryl Ada   [place] Providers Cardiologist:  None     Referring MD: Carlena Hurl, PA-C   No chief complaint on file. Atypical Cp  History of Present Illness:    Brett Gonzalez is a 31 y.o. male with a hx of HTN, GERD, no cardiac dx hx, notes occasional CP that lasts 5 minutes. It is sharp and tight. ECG 02/07/2022 was normal.  He is obese. He is on wegovy. He has SOB while watching TV and notes some sharp CP. He works at a physical job and plays golf.  No significant DOE. No family hx of CAD.  No smoking. No cardiac dx hx.  Past Medical History:  Diagnosis Date   ADD (attention deficit disorder)    Asthma    worse in childhood   Diverticulitis 2020   GERD (gastroesophageal reflux disease)    Hypertension    Wears contact lenses     Past Surgical History:  Procedure Laterality Date   I & D EXTREMITY Right 04/11/2014   Procedure: IRRIGATION AND DEBRIDEMENT RIGHT HAND AND MIDDLE FINGER WITH ;  Surgeon: Roseanne Kaufman, MD;  Location: [facility];  Service: Orthopedics;  Laterality: Right;   REPAIR EXTENSOR TENDON Right 04/11/2014   Procedure: EXTENSOR TENDON REPAIR AND RECONSTRUCTION ;  Surgeon: Roseanne Kaufman, MD;  Location: [facility];  Service: Orthopedics;  Laterality: Right;   ROTATOR CUFF REPAIR Right    TONSILLECTOMY     VASECTOMY      Current Medications: No outpatient medications have been marked as taking for the 03/08/22 encounter (Appointment) with Janina Mayo, MD.     Allergies:   Codeine   Social History   Socioeconomic History   Marital status: Married    Spouse name: Not on file   Number of children: Not on file   Years of education: Not on file   Highest education level: Not on file  Occupational History   Not on file  Tobacco Use   Smoking status: Former    Packs/day: 0.50    Years: 6.00    Total pack years: 3.00     Types: Cigarettes    Quit date: 01/03/2014    Years since quitting: 8.1   Smokeless tobacco: Current    Types: Snuff  Vaping Use   Vaping Use: Never used  Substance and Sexual Activity   Alcohol use: Yes    Alcohol/week: 1.0 standard drink of alcohol    Types: 1 Cans of beer per week    Comment: rare   Drug use: No   Sexual activity: Not on file  Other Topics Concern   Not on file  Social History Narrative   Engaged, getting married October 14.    Has 2 children, sees them every weekend.  Is a Cabin crew, delivers propane.    Walks, runs, dirt bike, plays with kids.  02/2022.   Social Determinants of Health   Financial Resource Strain: Not on file  Food Insecurity: Not on file  Transportation Needs: Not on file  Physical Activity: Not on file  Stress: Not on file  Social Connections: Not on file     Family History: The patient's family history includes Anxiety disorder in his father; Arthritis in his father and mother; Cancer in his maternal grandfather; Diabetes in his mother; Diverticulitis in his mother;  GER disease in his mother; Gallbladder disease in his mother; Hypertension in his father; Stroke in his paternal grandfather. There is no history of Heart disease.  ROS:   Please see the history of present illness.     All other systems reviewed and are negative.  EKGs/Labs/Other Studies Reviewed:    The following studies were reviewed today:   EKG:  EKG is  ordered today.  The ekg ordered today demonstrates   03/08/2022- NSR  Recent Labs: 02/07/2022: ALT 66; BUN 9; Creatinine, Ser 1.03; Hemoglobin 16.0; Magnesium 2.0; Platelets 261; Potassium 4.3; Sodium 140; TSH 1.980   Recent Lipid Panel    Component Value Date/Time   CHOL 261 (H) 02/07/2022 1151   TRIG 484 (H) 02/07/2022 1151   HDL 26 (L) 02/07/2022 1151   CHOLHDL 10.0 (H) 02/07/2022 1151   LDLCALC 144 (H) 02/07/2022 1151     Risk Assessment/Calculations:     Physical Exam:    VS:    Vitals:   03/08/22 1536  BP: 118/78  Pulse: 92  SpO2: 97%     Wt Readings from Last 3 Encounters:  02/07/22 283 lb 12.8 oz (128.7 kg)  11/21/21 285 lb 9.6 oz (129.5 kg)  11/22/20 267 lb 3.2 oz (121.2 kg)     GEN:  Well nourished, well developed in no acute distress HEENT: Normal NECK: No JVD; No carotid bruits LYMPHATICS: No lymphadenopathy CARDIAC: RRR, no murmurs, rubs, gallops RESPIRATORY:  Clear to auscultation without rales, wheezing or rhonchi  ABDOMEN: Soft, non-tender, non-distended MUSCULOSKELETAL:  No edema; No deformity  SKIN: Warm and dry NEUROLOGIC:  Alert and oriented x 3 PSYCHIATRIC:  Normal affect   ASSESSMENT:   Non cardiac CP: The nature of the pain is very atypical. His EKG today was normal. Considering the low pretest probability and atypical pain will not recommend further testing.  PLAN:    In order of problems listed above:  No further cardiac w/u           Medication Adjustments/Labs and Tests Ordered: Current medicines are reviewed at length with the patient today.  Concerns regarding medicines are outlined above.  No orders of the defined types were placed in this encounter.  No orders of the defined types were placed in this encounter.   There are no Patient Instructions on file for this visit.   Signed, Janina Mayo, MD  03/08/2022 1:16 PM    [facility]

## 2022-03-08 NOTE — Patient Instructions (Signed)

## 2022-03-14 ENCOUNTER — Encounter: Payer: Self-pay | Admitting: Internal Medicine

## 2022-07-27 ENCOUNTER — Other Ambulatory Visit: Payer: Self-pay | Admitting: Medical

## 2022-07-27 MED ORDER — WEGOVY 0.25 MG/0.5ML ~~LOC~~ SOAJ: 0.2500 mg | SUBCUTANEOUS | 0 refills | Status: DC

## 2022-08-10 ENCOUNTER — Ambulatory Visit
Admission: EM | Admit: 2022-08-10 | Discharge: 2022-08-10 | Disposition: A | Discharge status: Home / Self Care | Payer: BC Managed Care – PPO

## 2022-08-10 ENCOUNTER — Inpatient Hospital Stay: Admission: RE | Admit: 2022-08-10 | Payer: BC Managed Care – PPO | Source: Ambulatory Visit

## 2022-08-10 DIAGNOSIS — H6691 Otitis media, unspecified, right ear: Secondary | ICD-10-CM | POA: Diagnosis not present

## 2022-08-10 DIAGNOSIS — J101 Influenza due to other identified influenza virus with other respiratory manifestations: Secondary | ICD-10-CM | POA: Diagnosis not present

## 2022-08-10 LAB — POCT INFLUENZA A/B
Influenza A, POC: NEGATIVE
Influenza B, POC: POSITIVE — AB

## 2022-08-10 MED ORDER — PROMETHAZINE-DM 6.25-15 MG/5ML PO SYRP: 5.0000 mL | ORAL_SOLUTION | Freq: Four times a day (QID) | ORAL | 0 refills | Status: DC | PRN

## 2022-08-10 MED ORDER — OSELTAMIVIR PHOSPHATE 75 MG PO CAPS: 75.0000 mg | ORAL_CAPSULE | Freq: Two times a day (BID) | ORAL | 0 refills | Status: DC

## 2022-08-10 MED ORDER — AMOXICILLIN-POT CLAVULANATE 875-125 MG PO TABS: 1.0000 | ORAL_TABLET | Freq: Two times a day (BID) | ORAL | 0 refills | Status: DC

## 2022-08-10 NOTE — ED Provider Notes (Signed)
[facility]    CSN: [id number] Arrival date & time: 08/10/22  1107      History   Chief Complaint Chief Complaint  Patient presents with   Appointment    1130   Cough    HPI Brett Gonzalez is a 32 y.o. male.   The history is provided by the patient.   Patient presents with a 2-day history of fatigue, body aches, headache, right ear pain, and cough.  Patient reports fever, Tmax around 100-101.  Patient also complains of drainage from the right ear that he states was yellow and brown.  He denies for throat, decreased hearing, wheezing, shortness of breath, difficulty breathing, abdominal pain, nausea, vomiting, or diarrhea.  Patient reports that he has been taking DayQuil and NyQuil which give him some relief.  Past Medical History:  Diagnosis Date   ADD (attention deficit disorder)    Asthma    worse in childhood   Diverticulitis 2020   GERD (gastroesophageal reflux disease)    Hypertension    Wears contact lenses     Patient Active Problem List   Diagnosis Date Noted   Encounter for health maintenance examination in adult 02/07/2022   BMI 40.0-44.9, adult ([place]) 02/07/2022   Other fatigue 02/07/2022   Chest discomfort 02/07/2022   SOB (shortness of breath) 02/07/2022   Shakes 02/07/2022   Essential hypertension, benign 11/22/2020   History of diverticulitis 02/04/2019   Screening for diabetes mellitus 11/18/2018   Depression, major, in remission ([place]) 11/18/2018   Chewing tobacco use 11/18/2018   Gastroesophageal reflux disease 11/18/2018    Past Surgical History:  Procedure Laterality Date   I & D EXTREMITY Right 04/11/2014   Procedure: IRRIGATION AND DEBRIDEMENT RIGHT HAND AND MIDDLE FINGER WITH ;  Surgeon: Roseanne Kaufman, MD;  Location: [facility];  Service: Orthopedics;  Laterality: Right;   REPAIR EXTENSOR TENDON Right 04/11/2014   Procedure: EXTENSOR TENDON REPAIR AND RECONSTRUCTION ;  Surgeon: Roseanne Kaufman, MD;  Location: [facility];  Service:  Orthopedics;  Laterality: Right;   ROTATOR CUFF REPAIR Right    TONSILLECTOMY     VASECTOMY         Home Medications    Prior to Admission medications   Medication Sig Start Date End Date Taking? Authorizing Provider  amoxicillin-clavulanate (AUGMENTIN) 875-125 MG tablet Take 1 tablet by mouth every 12 (twelve) hours. 08/10/22  Yes Rakwon Letourneau-Warren, Alda Lea, NP  oseltamivir (TAMIFLU) 75 MG capsule Take 1 capsule (75 mg total) by mouth every 12 (twelve) hours. 08/10/22  Yes Julisa Flippo-Warren, Alda Lea, NP  promethazine-dextromethorphan (PROMETHAZINE-DM) 6.25-15 MG/5ML syrup Take 5 mLs by mouth 4 (four) times daily as needed for cough. 08/10/22  Yes Dinia Joynt-Warren, Alda Lea, NP  rosuvastatin (CRESTOR) 10 MG tablet Take 1 tablet (10 mg total) by mouth daily. 02/09/22 02/09/23 Yes Tysinger, Camelia Eng, PA-C  rosuvastatin (CRESTOR) 10 MG tablet Take 10 mg by mouth daily.   Yes [provider]  citalopram (CELEXA) 20 MG tablet Take 1 tablet (20 mg total) by mouth daily. 11/21/21   Tysinger, Camelia Eng, PA-C  omeprazole (PRILOSEC) 20 MG capsule TAKE (1) CAPSULE BY MOUTH ONCE DAILY. 11/21/21   Tysinger, Camelia Eng, PA-C  Semaglutide-Weight Management (WEGOVY) 0.25 MG/0.5ML SOAJ Inject 0.25 mg into the skin once a week. 07/27/22   Tysinger, Camelia Eng, PA-C  valsartan-hydrochlorothiazide (DIOVAN-HCT) 80-12.5 MG tablet Take 1 tablet by mouth daily. 02/09/22   Tysinger, Camelia Eng, PA-C    Family History Family History  Problem Relation  Age of Onset   Arthritis Mother    Diabetes Mother    Gallbladder disease Mother    GER disease Mother    Diverticulitis Mother    Hypertension Father    Arthritis Father    Anxiety disorder Father    Cancer Maternal Grandfather    Stroke Paternal Grandfather    Heart disease Neg Hx     Social History Social History   Tobacco Use   Smoking status: Former    Packs/day: 0.50    Years: 6.00    Total pack years: 3.00    Types: Cigarettes    Quit date: 01/03/2014    Years  since quitting: 8.6   Smokeless tobacco: Current    Types: Snuff  Vaping Use   Vaping Use: Never used  Substance Use Topics   Alcohol use: Yes    Alcohol/week: 1.0 standard drink of alcohol    Types: 1 Cans of beer per week    Comment: rare   Drug use: No     Allergies   Codeine   Review of Systems Review of Systems Per HPI  Physical Exam Triage Vital Signs ED Triage Vitals  Enc Vitals Group     BP 08/10/22 1143 (!) 140/89     Pulse Rate 08/10/22 1143 87     Resp 08/10/22 1143 18     Temp 08/10/22 1143 99.3 F (37.4 C)     Temp Source 08/10/22 1143 Oral     SpO2 08/10/22 1143 96 %     Weight --      Height --      Head Circumference --      Peak Flow --      Pain Score 08/10/22 1140 0     Pain Loc --      Pain Edu? --      Excl. in [place]? --    No data found.  Updated Vital Signs BP (!) 140/89 (BP Location: Right Arm)   Pulse 87   Temp 99.3 F (37.4 C) (Oral)   Resp 18   SpO2 96%   Visual Acuity Right Eye Distance:   Left Eye Distance:   Bilateral Distance:    Right Eye Near:   Left Eye Near:    Bilateral Near:     Physical Exam Vitals and nursing note reviewed.  Constitutional:      General: He is not in acute distress.    Appearance: Normal appearance.  HENT:     Head: Normocephalic.     Right Ear: Hearing, ear canal and external ear normal. Drainage present. Tympanic membrane is erythematous and bulging.     Left Ear: Tympanic membrane, ear canal and external ear normal.     Nose: Congestion present. No rhinorrhea.     Mouth/Throat:     Mouth: Mucous membranes are moist.     Pharynx: Posterior oropharyngeal erythema present. No oropharyngeal exudate.  Eyes:     Extraocular Movements: Extraocular movements intact.     Conjunctiva/sclera: Conjunctivae normal.     Pupils: Pupils are equal, round, and reactive to light.  Cardiovascular:     Rate and Rhythm: Normal rate and regular rhythm.     Pulses: Normal pulses.     Heart sounds: Normal  heart sounds.  Pulmonary:     Effort: Pulmonary effort is normal. No respiratory distress.     Breath sounds: Normal breath sounds. No stridor. No wheezing, rhonchi or rales.  Abdominal:     General:  Bowel sounds are normal.     Palpations: Abdomen is soft.     Tenderness: There is no abdominal tenderness.  Musculoskeletal:     Cervical back: Normal range of motion.  Lymphadenopathy:     Cervical: No cervical adenopathy.  Skin:    General: Skin is warm and dry.  Neurological:     General: No focal deficit present.     Mental Status: He is alert and oriented to person, place, and time.  Psychiatric:        Mood and Affect: Mood normal.        Behavior: Behavior normal.      UC Treatments / Results  Labs (all labs ordered are listed, but only abnormal results are displayed) Labs Reviewed  POCT INFLUENZA A/B - Abnormal; Notable for the following components:      Result Value   Influenza B, POC Positive (*)    All other components within normal limits    EKG   Radiology No results found.  Procedures Procedures (including critical care time)  Medications Ordered in UC Medications - No data to display  Initial Impression / Assessment and Plan / UC Course  I have reviewed the triage vital signs and the nursing notes.  Pertinent labs & imaging results that were available during my care of the patient were reviewed by me and considered in my medical decision making (see chart for details).  The patient is well-appearing, he is in no acute distress, vital signs are stable.  Influenza test was positive for influenza B.  Patient also with erythema and bulging of the right tympanic membrane.  Will start patient on Tamiflu '75mg'$  BID x 10 days. For the right otitis media, will start patient on Augmentin 875/'125mg'$  BID x 7 days. Symptomatic treatment provided with Promethazine DM for cough and Fluticasone 28mg nasal spray for nasal congestions. Supportive care recommendations were  provided for the patient to include fluids, rest, and over-the-counter analgesics for pain or discomfort. Discussed viral etiology with the patient and expected course of symptoms. Patient is in agreement with this plan of care, understanding verbalized. All questions were answered. Patient is stable for discharge. Work note was provided.   Final Clinical Impressions(s) / UC Diagnoses   Final diagnoses:  Influenza B  Right otitis media, unspecified otitis media type     Discharge Instructions      The influenza test was positive for influenza B. Take medication as prescribed. Increase fluids and allow for plenty of rest. Recommend over-the-counter Tylenol or ibuprofen as needed for pain, fever, or general discomfort. Recommend use of a humidifier in your bedroom at nighttime during sleep and sleeping elevated on pillows while cough symptoms persist. You will need to be fever free for at least 24 hours without the use of medication before returning to work. Please be advised that a viral illness can last from 7 to 14 days.  If your symptoms suddenly worsen before that time, or extend beyond that time, please follow-up with your primary care physician for further evaluation. Follow-up as needed.     ED Prescriptions     Medication Sig Dispense Auth. Provider   promethazine-dextromethorphan (PROMETHAZINE-DM) 6.25-15 MG/5ML syrup Take 5 mLs by mouth 4 (four) times daily as needed for cough. 118 mL Verdean Murin-Warren, CAlda Lea NP   amoxicillin-clavulanate (AUGMENTIN) 875-125 MG tablet Take 1 tablet by mouth every 12 (twelve) hours. 14 tablet Ralonda Tartt-Warren, CAlda Lea NP   oseltamivir (TAMIFLU) 75 MG capsule Take 1 capsule (75  mg total) by mouth every 12 (twelve) hours. 10 capsule Manar Smalling-Warren, Alda Lea, NP      PDMP not reviewed this encounter.   Tish Men, NP 08/10/22 1226

## 2022-08-10 NOTE — Discharge Instructions (Addendum)
The influenza test was positive for influenza B. Take medication as prescribed. Increase fluids and allow for plenty of rest. Recommend over-the-counter Tylenol or ibuprofen as needed for pain, fever, or general discomfort. Recommend use of a humidifier in your bedroom at nighttime during sleep and sleeping elevated on pillows while cough symptoms persist. You will need to be fever free for at least 24 hours without the use of medication before returning to work. Please be advised that a viral illness can last from 7 to 14 days.  If your symptoms suddenly worsen before that time, or extend beyond that time, please follow-up with your primary care physician for further evaluation. Follow-up as needed.

## 2022-08-10 NOTE — ED Triage Notes (Signed)
Pt reports he has cough, headache, fatigue, right ear pain, body aches x 2 days days. Denies any headache at this moment. Dayquil and Nyquil gives some relief.

## 2022-09-27 ENCOUNTER — Encounter: Payer: Self-pay | Admitting: Family Medicine

## 2022-09-27 ENCOUNTER — Ambulatory Visit: Payer: BC Managed Care – PPO | Admitting: Family Medicine

## 2022-09-27 VITALS — BP 132/90 | HR 74 | Temp 98.2°F | Resp 18 | Wt 288.0 lb

## 2022-09-27 DIAGNOSIS — R0789 Other chest pain: Secondary | ICD-10-CM

## 2022-09-27 NOTE — Progress Notes (Signed)
   Subjective:    Patient ID: Brett Gonzalez, male    DOB: July 16, 1990, 32 y.o.   MRN: [id number]  HPI He is here for evaluation of continued difficulty with intermittent chest tightness and some slight shortness of breath.  This has been going on for quite some time and he has been seen in the past by [facility] and was also referred to cardiology.  He has also had EKGs in the past.  Those notes were reviewed and the cardiologist could find no cardiac reason for this.  This next episode occurred last night which was around 8:00.  He ate around 5 and had no difficulty with that.  He was watching TV with his wife and noted to chest tightness but no sweating weakness numbness tingling.  It did go away fairly quickly.  He does not smoke.  His job is very active and he has never had trouble with chest tightness during physical activity.   Review of Systems     Objective:   Physical Exam Alert and in no distress. Tympanic membranes and canals are normal. Pharyngeal area is normal. Neck is supple without adenopathy or thyromegaly. Cardiac exam shows a regular sinus rhythm without murmurs or gallops. Lungs are clear to auscultation. EKG read by me shows a rate of 80 and other parameters are totally normal.  It was compared previously and was again normal.       Assessment & Plan:  Feeling of chest tightness I told him that I found no reason for this chest tightness but could tell him that it did not seem to be cardiac, pulmonary GI or psychological in nature.  Recommend that in the future check his pulse rate, pay attention to any other symptoms that might be associated with it.  He was comfortable with that.

## 2022-12-12 ENCOUNTER — Other Ambulatory Visit: Payer: Self-pay | Admitting: Medical

## 2022-12-12 DIAGNOSIS — F321 Major depressive disorder, single episode, moderate: Secondary | ICD-10-CM

## 2023-03-26 ENCOUNTER — Emergency Department (HOSPITAL_COMMUNITY)
Admission: EM | Admit: 2023-03-26 | Discharge: 2023-03-26 | Disposition: A | Discharge status: Home / Self Care | Payer: BC Managed Care – PPO | Attending: Emergency Medicine | Admitting: Emergency Medicine

## 2023-03-26 ENCOUNTER — Ambulatory Visit: Payer: BC Managed Care – PPO | Admitting: Medical

## 2023-03-26 ENCOUNTER — Encounter (HOSPITAL_COMMUNITY): Payer: Self-pay | Admitting: Emergency Medicine

## 2023-03-26 ENCOUNTER — Other Ambulatory Visit: Payer: Self-pay

## 2023-03-26 VITALS — BP 118/70 | HR 86 | Wt 271.8 lb

## 2023-03-26 DIAGNOSIS — E119 Type 2 diabetes mellitus without complications: Secondary | ICD-10-CM

## 2023-03-26 DIAGNOSIS — E1165 Type 2 diabetes mellitus with hyperglycemia: Secondary | ICD-10-CM | POA: Diagnosis not present

## 2023-03-26 DIAGNOSIS — R3589 Other polyuria: Secondary | ICD-10-CM | POA: Diagnosis not present

## 2023-03-26 DIAGNOSIS — I10 Essential (primary) hypertension: Secondary | ICD-10-CM | POA: Diagnosis not present

## 2023-03-26 DIAGNOSIS — H538 Other visual disturbances: Secondary | ICD-10-CM | POA: Diagnosis not present

## 2023-03-26 DIAGNOSIS — R634 Abnormal weight loss: Secondary | ICD-10-CM

## 2023-03-26 DIAGNOSIS — E669 Obesity, unspecified: Secondary | ICD-10-CM | POA: Diagnosis not present

## 2023-03-26 DIAGNOSIS — Z6837 Body mass index (BMI) 37.0-37.9, adult: Secondary | ICD-10-CM | POA: Diagnosis not present

## 2023-03-26 DIAGNOSIS — R739 Hyperglycemia, unspecified: Secondary | ICD-10-CM | POA: Diagnosis not present

## 2023-03-26 DIAGNOSIS — R631 Polydipsia: Secondary | ICD-10-CM | POA: Diagnosis not present

## 2023-03-26 LAB — COMPREHENSIVE METABOLIC PANEL
ALT: 82 U/L — ABNORMAL HIGH (ref 0–44)
AST: 52 U/L — ABNORMAL HIGH (ref 15–41)
Albumin: 5 g/dL (ref 3.5–5.0)
Alkaline Phosphatase: 121 U/L (ref 38–126)
Anion gap: 12 (ref 5–15)
BUN: 13 mg/dL (ref 6–20)
CO2: 26 mmol/L (ref 22–32)
Calcium: 10.3 mg/dL (ref 8.9–10.3)
Chloride: 93 mmol/L — ABNORMAL LOW (ref 98–111)
Creatinine, Ser: 1.06 mg/dL (ref 0.61–1.24)
GFR, Estimated: >60 mL/min (ref >60)
Glucose, Bld: 446 mg/dL — ABNORMAL HIGH (ref 70–99)
Potassium: 4 mmol/L (ref 3.5–5.1)
Sodium: 131 mmol/L — ABNORMAL LOW (ref 135–145)
Total Bilirubin: 1.5 mg/dL — ABNORMAL HIGH (ref 0.3–1.2)
Total Protein: 8.9 g/dL — ABNORMAL HIGH (ref 6.5–8.1)

## 2023-03-26 LAB — URINALYSIS, ROUTINE W REFLEX MICROSCOPIC
Bacteria, UA: NONE SEEN
Bilirubin Urine: NEGATIVE
Glucose, UA: >=500 mg/dL — AB
Hgb urine dipstick: NEGATIVE
Ketones, ur: 5 mg/dL — AB
Leukocytes,Ua: NEGATIVE
Nitrite: NEGATIVE
Protein, ur: NEGATIVE mg/dL
Specific Gravity, Urine: 1.029 (ref 1.005–1.030)
pH: 5 (ref 5.0–8.0)

## 2023-03-26 LAB — POCT URINALYSIS DIP (PROADVANTAGE DEVICE)
Bilirubin, UA: NEGATIVE
Blood, UA: NEGATIVE
Glucose, UA: >=1000 mg/dL — AB
Ketones, POC UA: NEGATIVE mg/dL
Leukocytes, UA: NEGATIVE
Nitrite, UA: NEGATIVE
Protein Ur, POC: NEGATIVE mg/dL
Specific Gravity, Urine: 1.01
Urobilinogen, Ur: NEGATIVE
pH, UA: 6 (ref 5.0–8.0)

## 2023-03-26 LAB — CBC
HCT: 47.6 % (ref 39.0–52.0)
Hemoglobin: 16.2 g/dL (ref 13.0–17.0)
MCH: 30.1 pg (ref 26.0–34.0)
MCHC: 34 g/dL (ref 30.0–36.0)
MCV: 88.5 fL (ref 80.0–100.0)
Platelets: 311 10*3/uL (ref 150–400)
RBC: 5.38 MIL/uL (ref 4.22–5.81)
RDW: 12.3 % (ref 11.5–15.5)
WBC: 7.3 10*3/uL (ref 4.0–10.5)
nRBC: 0 % (ref 0.0–0.2)

## 2023-03-26 LAB — CBG MONITORING, ED
Glucose-Capillary: 430 mg/dL — ABNORMAL HIGH (ref 70–99)
Glucose-Capillary: 426 mg/dL — ABNORMAL HIGH (ref 70–99)
Glucose-Capillary: 298 mg/dL — ABNORMAL HIGH (ref 70–99)

## 2023-03-26 LAB — BLOOD GAS, VENOUS
Acid-Base Excess: 6.1 mmol/L — ABNORMAL HIGH (ref 0.0–2.0)
Bicarbonate: 31.9 mmol/L — ABNORMAL HIGH (ref 20.0–28.0)
Drawn by: 66297
O2 Saturation: 18.4 %
Patient temperature: 36.9
pCO2, Ven: 48 mm[Hg] (ref 44–60)
pH, Ven: 7.43 (ref 7.25–7.43)
pO2, Ven: <31 mm[Hg] — CL (ref 32–45)

## 2023-03-26 LAB — POCT GLYCOSYLATED HEMOGLOBIN (HGB A1C): Hemoglobin A1C: 10.2 % — AB (ref 4.0–5.6)

## 2023-03-26 LAB — GLUCOSE, POCT (MANUAL RESULT ENTRY): POC Glucose: 499 mg/dL — AB (ref 70–99)

## 2023-03-26 LAB — BETA-HYDROXYBUTYRIC ACID: Beta-Hydroxybutyric Acid: 0.52 mmol/L — ABNORMAL HIGH (ref 0.05–0.27)

## 2023-03-26 MED ORDER — INSULIN ASPART 100 UNIT/ML IJ SOLN
8.0000 [IU] | Freq: Once | INTRAMUSCULAR | Status: AC
  Administered 2023-03-26: 8 [IU] via SUBCUTANEOUS
  Filled 2023-03-26: qty 1

## 2023-03-26 MED ORDER — SODIUM CHLORIDE 0.9 % IV BOLUS
1000.0000 mL | Freq: Once | INTRAVENOUS | Status: AC
  Administered 2023-03-26: 1000 mL via INTRAVENOUS

## 2023-03-26 MED ORDER — LIVING WELL WITH DIABETES BOOK
Freq: Once | Status: DC
  Filled 2023-03-26: qty 1

## 2023-03-26 MED ORDER — METFORMIN HCL 500 MG PO TABS: 500.0000 mg | ORAL_TABLET | Freq: Two times a day (BID) | ORAL | 0 refills | Status: DC

## 2023-03-26 NOTE — Inpatient Diabetes Management (Addendum)
Inpatient Diabetes Program Recommendations  AACE/ADA: New Consensus Statement on Inpatient Glycemic Control   Target Ranges:  Prepandial:   less than 140 mg/dL      Peak postprandial:   less than 180 mg/dL (1-2 hours)      Critically ill patients:  140 - 180 mg/dL    Latest Reference Range & Units 03/26/23 12:17  Glucose-Capillary 70 - 99 mg/dL 086 (H)    Latest Reference Range & Units 03/26/23 12:58  CO2 22 - 32 mmol/L 26  Glucose 70 - 99 mg/dL 578 (H)  BUN 6 - 20 mg/dL 13  Creatinine 4.69 - 6.29 mg/dL 5.28  Calcium 8.9 - 41.3 mg/dL 24.4  Anion gap 5 - 15  12    Latest Reference Range & Units 02/07/22 11:51 03/26/23 10:10  Hemoglobin A1C 4.0 - 5.6 % 6.3 (H) 10.2 !   Review of Glycemic Control  Diabetes history: Prediabetes Outpatient Diabetes medications: None Current orders for Inpatient glycemic control: None; in ED waiting  Inpatient Diabetes Program Recommendations:    Outpatient DM: If patient is discharged  from ED on DM medication, would recommend Metformin 500 mg BID and Glipizide 2.5 mg daily. Please provide RX for glucose monitoring kit (#[id number]) and encourage patient to follow up with PCP later this week.  NOTE: Patient presented to ED following visit with PCP and being told glucose was 499 mg/dl. Patient is currently in [facility]2 ED waiting.  Spoke with patient in ED VT2 waiting area. Patient states that he had prediabetes hx. He states he has been having symptoms of hyperglycemia for about 2 weeks. Patient reports that he has been very fatigued and went to PCP this morning due to symptoms. Patient states he was told his glucose was 499 mg/dl at PCP office so he was advised to come to the ED.  Patient denies any recent steroids. Patient reports that he was drinking a lot of regular sodas but about 1 week ago he decided to cut back on sodas and he has been drinking about 6 bottles of water daily. Patient states that he did drink 10 oz of regular Mt Dew this morning to see if  the caffeine would help him feel better.  Discussed A1C results (10.2% on 03/26/23) and explained what an A1C is and informed patient that his current A1C indicates an average glucose of  246 mg/dl over the past 2-3 months. Discussed basic pathophysiology of DM Type 2, basic home care, importance of checking CBGs and maintaining good CBG control to prevent long-term and short-term complications. Reviewed glucose and A1C goals.  Reviewed signs and symptoms of hyperglycemia and hypoglycemia along with treatment for both. Discussed Carb Modified diet, portion control, and encouraged patient to eliminate sugary drinks completely. Informed patient that a Living Well with diabetes booklet would be ordered and encouraged patient to read through entire book once received.Patient confirms that he has [employer]. Informed patient that it would be requested that he be prescribed glucose monitoring kit. Explained that if copay is expensive, he could go to [employer] to get the Reli-On Prime glucometer for $9 and a box of 50 Reli-On test strips for $9 if that was more affordable. Patient reports that his PCP said he may need to be started on IV insulin and be admitted.   Discussed that at this time, unclear if hyperglycemia would be treated with IV insulin or SQ insulin. Discussed that if he were to be admitted and ordered IV insulin, then I would follow up  with him tomorrow.  Discussed that if ED provider treats hyperglycemia and discharges on oral DM medication, it would be recommended to prescribe Metformin and Glipizide. Discussed how each medication works for DM control. Asked patient to take DM medication as prescribed, check glucose as advised, and recommended he follow up with PCP before the end of the week. 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 Patient verbalized understanding of information discussed and he states  that he has no further questions at this time related to diabetes.   Spoke with Achille Rich, PA-C regarding conversation with patient and discharge recommendations if patient is prescribed oral DM medications and discharged home from ED.  Ordered Living Well with DM book and added clinical reference documents to discharge instructions regarding DM management.  Thanks, Orlando Penner, RN, MSN, CDE Diabetes Coordinator Inpatient Diabetes Program [phone] (Team Pager from 8am to 5pm)

## 2023-03-26 NOTE — ED Triage Notes (Signed)
Pt c/o fatigue x a few days with nausea today. Seen at pcp office this am- reports glucose 499. Previous dx of pre-diabetes.

## 2023-03-26 NOTE — Progress Notes (Addendum)
Subjective:  Brett Gonzalez is a 32 y.o. male who presents for Chief Complaint  Patient presents with   Urinary Frequency    Urinary frequency- having to go every 2 hours and then 4-5 during the night. Fatigue, Thirsty all the time. Cut back on portions- no appetite.      Here for symptoms of concern.  For the past 2-3 weeks been having symptoms, but in last 2 moths feeling different.  Current symptoms include increased thirst, increased urination, blurred vision, and recent weight loss.  Stopped tobacco 2.5 weeks ago.   Is drinking more water of late.  Drinking 5-6 bottles of water daily.  Has cut back on soda, but still desired caffeine or else gets headaches.  Having urinary frequency, thirsty all the time and has lost weight recently.  Has eaten this morning, mountain dew  No other aggravating or relieving factors.    No other c/o.   Past Medical History:  Diagnosis Date   ADD (attention deficit disorder)    Asthma    worse in childhood   Diverticulitis 2020   GERD (gastroesophageal reflux disease)    Hypertension    Wears contact lenses    Current Outpatient Medications on File Prior to Visit  Medication Sig Dispense Refill   citalopram (CELEXA) 20 MG tablet TAKE ONE TABLET BY MOUTH ONCE DAILY. 90 tablet 0   omeprazole (PRILOSEC) 20 MG capsule TAKE (1) CAPSULE BY MOUTH ONCE DAILY. 90 capsule 0   rosuvastatin (CRESTOR) 10 MG tablet Take 1 tablet (10 mg total) by mouth daily. 90 tablet 3   valsartan-hydrochlorothiazide (DIOVAN-HCT) 80-12.5 MG tablet Take 1 tablet by mouth daily. 90 tablet 3   No current facility-administered medications on file prior to visit.   Family History  Problem Relation Age of Onset   Arthritis Mother    Diabetes Mother    Gallbladder disease Mother    GER disease Mother    Diverticulitis Mother    Hypertension Father    Arthritis Father    Anxiety disorder Father    Cancer Maternal Grandfather    Stroke Paternal Grandfather    Heart  disease Neg Hx     The following portions of the patient's history were reviewed and updated as appropriate: allergies, current medications, past family history, past medical history, past social history, past surgical history and problem list.  ROS Otherwise as in subjective above    Objective: BP 118/70   Pulse 86   Wt 271 lb 12.8 oz (123.3 kg)   BMI 37.91 kg/m   Wt Readings from Last 3 Encounters:  03/26/23 271 lb 12.8 oz (123.3 kg)  09/27/22 288 lb (130.6 kg)  03/08/22 281 lb 12.8 oz (127.8 kg)   General appearance: alert, no distress, well developed, well nourished Not diaphoretic Pleasant, normal affect     Assessment: Encounter Diagnoses  Name Primary?   New onset type 2 diabetes mellitus (HCC) Yes   Polyuria    Polydipsia    Blurred vision    Weight loss    Essential hypertension, benign      Plan: We discussed his symptoms.  Blood sugar 499 today.  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  He is going to go to the  emergency department for more urgent treatment and evaluation as it is urgent that we get his sugars under better control  Counseled a good bit on diet, current evaluation plan today, short-term follow-up here after the emergency department visit, impact on driving/DOT, and need to get blood sugars under control in a short period of time  I will have him report to emergency dept   I tried to call ED twice to emergency dept triage, but never got anyone to answer.   English was seen today for urinary frequency.  Diagnoses and all orders for this visit:  New onset type 2 diabetes mellitus (HCC) -     POCT Urinalysis DIP (Proadvantage Device) -     Glucose (CBG) -     HgB A1c  Polyuria  Polydipsia  Blurred  vision  Weight loss  Essential hypertension, benign  Spent > 30 minutes face to face with patient in discussion of symptoms, evaluation, plan and recommendations.    [facility] up: [facility]

## 2023-03-26 NOTE — ED Provider Notes (Signed)
[place] EMERGENCY DEPARTMENT AT [facility] Provider Note   CSN: [id number] Arrival date & time: 03/26/23  1104     History  Chief Complaint  Patient presents with   Hyperglycemia    Brett Gonzalez is a 32 y.o. male.   Hyperglycemia Associated symptoms: fatigue, increased thirst, nausea and polyuria   Associated symptoms: no abdominal pain, no chest pain, no confusion, no fever, no shortness of breath, no vomiting and no weakness        Brett Gonzalez is a 32 y.o. male past medical history of prediabetes, hypertension, GERD who presents to the Emergency Department from his PCPs office for evaluation of hyperglycemia.  States that he was diagnosed with prediabetes last year.  Over the weekend, he has been noticing increased thirst and urination with generalized fatigue and weakness.  States he has been monitored for diabetes for over 1 year.  A1c last September was 6.3.  At PCPs office earlier today was 10.2.  Ate to bread sticks with marinara sauce and drink [facility] this morning for breakfast.  Recent symptoms have also been associated with nausea, no abdominal pain chest pain or vomiting.  States PCP sent him here for further evaluation.  He is not currently on any antihyperglycemic medications.   Home Medications Prior to Admission medications   Medication Sig Start Date End Date Taking? Authorizing Provider  citalopram (CELEXA) 20 MG tablet TAKE ONE TABLET BY MOUTH ONCE DAILY. 12/13/22   Tysinger, Kermit Balo, PA-C  omeprazole (PRILOSEC) 20 MG capsule TAKE (1) CAPSULE BY MOUTH ONCE DAILY. 12/13/22   Tysinger, Kermit Balo, PA-C  rosuvastatin (CRESTOR) 10 MG tablet Take 1 tablet (10 mg total) by mouth daily. 02/09/22 03/26/23  Tysinger, Kermit Balo, PA-C  valsartan-hydrochlorothiazide (DIOVAN-HCT) 80-12.5 MG tablet Take 1 tablet by mouth daily. 02/09/22   Tysinger, Kermit Balo, PA-C      Allergies    Codeine    Review of Systems   Review of Systems  Constitutional:   Positive for fatigue. Negative for appetite change, chills and fever.  HENT:  Negative for trouble swallowing.   Respiratory:  Negative for chest tightness and shortness of breath.   Cardiovascular:  Negative for chest pain.  Gastrointestinal:  Positive for nausea. Negative for abdominal pain, diarrhea and vomiting.  Endocrine: Positive for polydipsia and polyuria.  Genitourinary:  Positive for frequency. Negative for decreased urine volume and difficulty urinating.  Musculoskeletal:  Negative for arthralgias, back pain, neck pain and neck stiffness.  Neurological:  Negative for syncope, weakness and numbness.  Psychiatric/Behavioral:  Negative for confusion.     Physical Exam Updated Vital Signs BP 118/69   Pulse 98   Temp 98.4 F (36.9 C)   Resp (!) 22   Ht 5\' 11"  (1.803 m)   Wt 122.9 kg   SpO2 96%   BMI 37.80 kg/m  Physical Exam Vitals and nursing note reviewed.  Constitutional:      General: He is not in acute distress.    Appearance: Normal appearance. He is obese. He is not ill-appearing or toxic-appearing.  HENT:     Mouth/Throat:     Mouth: Mucous membranes are moist.     Pharynx: Oropharynx is clear.  Cardiovascular:     Rate and Rhythm: Normal rate and regular rhythm.     Pulses: Normal pulses.  Pulmonary:     Effort: Pulmonary effort is normal.  Abdominal:     Palpations: Abdomen is soft.     Tenderness:  There is no abdominal tenderness.  Musculoskeletal:     Cervical back: Normal range of motion.     Right lower leg: No edema.     Left lower leg: No edema.  Skin:    General: Skin is warm.     Capillary Refill: Capillary refill takes less than 2 seconds.  Neurological:     General: No focal deficit present.     Mental Status: He is alert.     Sensory: No sensory deficit.     Motor: No weakness.     ED Results / Procedures / Treatments   Labs (all labs ordered are listed, but only abnormal results are displayed) Labs Reviewed  URINALYSIS, ROUTINE  W REFLEX MICROSCOPIC - Abnormal; Notable for the following components:      Result Value   Color, Urine STRAW (*)    Glucose, UA >=500 (*)    Ketones, ur 5 (*)    All other components within normal limits  COMPREHENSIVE METABOLIC PANEL - Abnormal; Notable for the following components:   Sodium 131 (*)    Chloride 93 (*)    Glucose, Bld 446 (*)    Total Protein 8.9 (*)    AST 52 (*)    ALT 82 (*)    Total Bilirubin 1.5 (*)    All other components within normal limits  BLOOD GAS, VENOUS - Abnormal; Notable for the following components:   pO2, Ven <31 (*)    Bicarbonate 31.9 (*)    Acid-Base Excess 6.1 (*)    All other components within normal limits  BETA-HYDROXYBUTYRIC ACID - Abnormal; Notable for the following components:   Beta-Hydroxybutyric Acid 0.52 (*)    All other components within normal limits  CBG MONITORING, ED - Abnormal; Notable for the following components:   Glucose-Capillary 430 (*)    All other components within normal limits  CBC  CBG MONITORING, ED  CBG MONITORING, ED    EKG None  Radiology No results found.  Procedures Procedures    Medications Ordered in ED Medications  sodium chloride 0.9 % bolus 1,000 mL (0 mLs Intravenous Stopped 03/26/23 1715)  sodium chloride 0.9 % bolus 1,000 mL (0 mLs Intravenous Stopped 03/26/23 1937)  insulin aspart (novoLOG) injection 8 Units (8 Units Subcutaneous Given 03/26/23 1742)    ED Course/ Medical Decision Making/ A&P                                 Medical Decision Making Patient sent here from PCPs office for evaluation of hyperglycemia.  New onset diabetes suspected.  History of prediabetes, not currently on any antihyperglycemic medications.  A1c was 6 last year, 10.2 today in PCPs office.  Patient endorses polyuria and polydipsia.  Family history of diabetes as well.  Suspect symptoms are related to new onset diabetes, DKA and HHS also considered but felt less likely.  Will check labs administer IV  fluids and likely insulin.  Amount and/or Complexity of Data Reviewed Labs: ordered.    Details: Labs interpreted by me, no leukocytosis, chemistries blood sugar of 446 kidney functions unremarkable, anion gap 12.  Blood gas shows elevated bicarb today Discussion of management or test interpretation with external provider(s): Patient here for evaluation of hypoglycemia.  Blood sugar elevated 446 given insulin and IV fluids.  Recheck, blood sugar improved at 298.  Patient reports feeling better.  New onset diabetes, diabetic education provided will start on metformin.  Patient agreeable to close outpatient follow-up with PCP  Risk Prescription drug management.           Final Clinical Impression(s) / ED Diagnoses Final diagnoses:  None    Rx / DC Orders ED Discharge Orders     None         Pauline Aus, PA-C 03/29/23 1054    Bethann Berkshire, MD 04/03/23 [phone]

## 2023-03-26 NOTE — Discharge Instructions (Addendum)
Your blood sugar today was elevated.  You have been given food and fluid and insulin to bring your sugar down.  Please avoid excessive carbs and sweets in your diet.  You have also been provided medications for proper guidance for diabetes.  Please follow-up with your primary care provider for recheck.  Return to the emergency department for any new or worsening symptoms.  I also recommend that you get a glucometer with test strips and lancets so that you may monitor your blood sugar daily at home.

## 2023-03-29 ENCOUNTER — Ambulatory Visit: Payer: BC Managed Care – PPO | Admitting: Medical

## 2023-03-29 ENCOUNTER — Encounter: Payer: Self-pay | Admitting: Medical

## 2023-03-29 VITALS — BP 120/82 | HR 97 | Wt 271.4 lb

## 2023-03-29 DIAGNOSIS — E1165 Type 2 diabetes mellitus with hyperglycemia: Secondary | ICD-10-CM

## 2023-03-29 MED ORDER — GLIPIZIDE 5 MG PO TABS: 5.0000 mg | ORAL_TABLET | Freq: Two times a day (BID) | ORAL | 2 refills | Status: DC

## 2023-03-29 MED ORDER — METFORMIN HCL 500 MG PO TABS: 500.0000 mg | ORAL_TABLET | Freq: Two times a day (BID) | ORAL | 2 refills | Status: DC

## 2023-03-29 NOTE — Progress Notes (Signed)
Subjective:  Brett Gonzalez is a 32 y.o. male who presents for Chief Complaint  Patient presents with   Hospitalization Follow-up    BS is still somewhat high. Reading today is 258 and 252. Yesterday was 301, 419,30 277. Only on metformin     Here for short-term follow-up.  I saw him earlier in the week for new onset diabetes, blood sugar 499 at that visit.  He went to the emergency department that they had IV fluids, some insulin.  Discharged on metformin.  He was not admitted to the hospital.  Since then he has been making significant changes with diet.  He got a glucometer has been checking sugars.  In the last couple days he is regularly seeing 2 50-2 70 fasting instead of over 400.  He is taking the metformin without complaint.  No other aggravating or relieving factors.    No other c/o.  Past Medical History:  Diagnosis Date   ADD (attention deficit disorder)    Asthma    worse in childhood   Diverticulitis 2020   GERD (gastroesophageal reflux disease)    Hypertension    Wears contact lenses    Current Outpatient Medications on File Prior to Visit  Medication Sig Dispense Refill   citalopram (CELEXA) 20 MG tablet TAKE ONE TABLET BY MOUTH ONCE DAILY. 90 tablet 0   omeprazole (PRILOSEC) 20 MG capsule TAKE (1) CAPSULE BY MOUTH ONCE DAILY. 90 capsule 0   valsartan-hydrochlorothiazide (DIOVAN-HCT) 80-12.5 MG tablet Take 1 tablet by mouth daily. 90 tablet 3   rosuvastatin (CRESTOR) 10 MG tablet Take 1 tablet (10 mg total) by mouth daily. 90 tablet 3   No current facility-administered medications on file prior to visit.     The following portions of the patient's history were reviewed and updated as appropriate: allergies, current medications, past family history, past medical history, past social history, past surgical history and problem list.  ROS Otherwise as in subjective above     Objective: BP 120/82   Pulse 97   Wt 271 lb 6.4 oz (123.1 kg)   BMI 37.85  kg/m   General appearance: alert, no distress, well developed, well nourished    Assessment: Encounter Diagnosis  Name Primary?   Type 2 diabetes mellitus with hyperglycemia, without long-term current use of insulin (HCC) Yes     Plan: We discussed his recent emergency department visit, labs, and current findings.  We will add glipizide for the time being.  Spent a lot of time counseling on diet, food choices, exercise, glucose testing.  Follow-up in 1 month  Patient Instructions  Recommendations Continue metformin 500 mg twice daily Add glipizide 5 mg twice daily Check your blood sugars fasting in the morning Goal is between 80 and 130 fasting blood sugar.  It will probably take a few weeks to see your numbers stabilize down to this range Try to get exercise 4 to 5 days/week, 45 to 60 minutes per session Drink plenty of water throughout the day Eat 2-4 fruit servings daily Eat vegetables throughout the day except for white potatoes Eat lean pieces of nonfried meat 4 to 5 ounces per serving Eat small portions of dairy such as yogurt, Greek yogurt, cottage cheese, 2% milk For the next month possibly avoid grains and breads and cereals in general If you do eat grains, typically use 1 cup or less per serving such as quinoa, barley, still cut oats, couscous, whole-grain pasta, whole-grain bread Work on efforts to lose weight I  would like to see you back in 1 month to check your progress Check your insurance for coverage for Ozempic or Mounjaro.  I may want to transition you to 1 of these medicines going forward   Type 2 Diabetes Diabetes is a long-lasting (chronic) disease.  With diabetes, either the pancreas does not make enough of a hormone called insulin, or the body has trouble using the insulin that is made. Over time, diabetes can damage the eyes, kidneys, and nerves causing retinopathy, nephropathy, and neuropathy.  Diabetes puts you at risk for heart disease and peripheral  vascular disease which can lead to heart attack, stroke, foot ulcers, and amputations.   Our goal and hopefully your goal is to manage your diabetes in such a way to slow the progression of the disease and do all we can to keep you healthy  Home Care:  Eat healthy, exercise regularly, limit alcohol, and don't smoke! Check your blood sugar (glucose) once a day before breakfast, or as indicated by our discussion today.  The goal is to keep your morning fasting sugar less than 130.  In general you should try to keep your blood sugars between 80 - 130 fasting or before meals. We also want to avoid hypoglycemia or low blood sugars less than 70.  Make sure you understand how to treat hypoglycemic episodes.  Ask me about this if we had not discussed this. If you are on medication, take your medications daily, don't run out of medications. Learn about low blood sugar (hypoglycemia). Know how to treat it. Wear a necklace or bracelet that says you have diabetes in the event of emergency for first responders to identify you have diabetes. Check your feet every night for cuts, sores, blisters, and redness. Tell your medical provider if you have problems. Maintain a normal body weight, or normal BMI - height to weight ratio of 20-25.  Ask me about this.  GET HELP RIGHT AWAY IF: You have trouble keeping your blood sugar in target range. You have problems with your medicines. You are sick and not getting better after 24 hours. You have a sore or wound that is not healing. You have vision problems or changes. You have a fever.   Exercise regularly since it has beneficial effects on the heart and blood sugars. Exercising at least three times per week or 150 minutes per week can be as important as medication to a diabetic.  Find some form of exercise that you will enjoy doing regularly.  This can include walking, biking, kayaking, golfing, swimming, dance, aerobics, hiking, etc.  If you have joint problems, many  local gyms have equipment to accommodate people with specific needs.    Vaccinations:  Diabetics are at increased risk for infection, and illnesses can take longer to resolve.  Current vaccine recommendations include yearly Influenza (flu) vaccine (recommended in October), Pneumococcal vaccine, Hepatitis B vaccine series, Tdap (tetanus, diptheria and pertussis) vaccine every 10 years, Covid vaccination, and other age appropriate vaccinations.     Office visits:  We recommend routine medical care to make sure we are addressing prevention and issues as they arise.  Typically this could mean twice yearly or up to quarterly depending upon your unique health situation.  Exams should include a yearly physical, a yearly foot exam, and other examination as appropriate.  You should see an eye doctor yearly to help screen for and prevent blood vessel complications in your eyes.  Labs: Diabetics should have blood work done at least  twice yearly to monitor your Hemoglobin A1C (a three-month average of your blood sugars) and your cholesterol.  You should have your urine and blood checked yearly to screen for kidney damage.  This may include creatinine and micro-albumin levels.  Other labs as appropriate.    Blood pressure goals:  Goal blood pressure in diabetics should be 130/80 or less. Monitoring your blood pressure with a home blood pressure cuff of your own is an excellent idea.  If you are prescribed medication for blood pressure, take your medication every day, and don't run out of medication.  Having high blood pressure can damage your heart, eyes, kidneys, and put you at risk for heart attack and stroke.  Tobacco use:  If you smoke, dip or chew, quitting will reduce your risk of heart attack, stroke, peripheral vascular disease, and many cancers.  Other Specialists:   Eye doctor: See your eye doctor yearly for routine eye exam and diabetic eye exam.  Make sure your eye doctor sends [place] a copy of the  report  Dentist: See your dentist twice yearly for dental hygiene visits and routine dental care.  Brush and floss your teeth every day.  Dental hygiene is and important part of diabetic care.  Diabetics sometimes see other specialist depending on the complexity of your health issues.     Brett Gonzalez was seen today for hospitalization follow-up.  Diagnoses and all orders for this visit:  Type 2 diabetes mellitus with hyperglycemia, without long-term current use of insulin (HCC)  Other orders -     glipiZIDE (GLUCOTROL) 5 MG tablet; Take 1 tablet (5 mg total) by mouth 2 (two) times daily before a meal. -     metFORMIN (GLUCOPHAGE) 500 MG tablet; Take 1 tablet (500 mg total) by mouth 2 (two) times daily with a meal.    Follow up: 38mo

## 2023-03-29 NOTE — Patient Instructions (Addendum)
Recommendations Continue metformin 500 mg twice daily Add glipizide 5 mg twice daily Check your blood sugars fasting in the morning Goal is between 80 and 130 fasting blood sugar.  It will probably take a few weeks to see your numbers stabilize down to this range Try to get exercise 4 to 5 days/week, 45 to 60 minutes per session Drink plenty of water throughout the day Eat 2-4 fruit servings daily Eat vegetables throughout the day except for white potatoes Eat lean pieces of nonfried meat 4 to 5 ounces per serving Eat small portions of dairy such as yogurt, Greek yogurt, cottage cheese, 2% milk For the next month possibly avoid grains and breads and cereals in general If you do eat grains, typically use 1 cup or less per serving such as quinoa, barley, still cut oats, couscous, whole-grain pasta, whole-grain bread Work on efforts to lose weight I would like to see you back in 1 month to check your progress Check your insurance for coverage for Ozempic or Mounjaro.  I may want to transition you to 1 of these medicines going forward   Type 2 Diabetes Diabetes is a long-lasting (chronic) disease.  With diabetes, either the pancreas does not make enough of a hormone called insulin, or the body has trouble using the insulin that is made. Over time, diabetes can damage the eyes, kidneys, and nerves causing retinopathy, nephropathy, and neuropathy.  Diabetes puts you at risk for heart disease and peripheral vascular disease which can lead to heart attack, stroke, foot ulcers, and amputations.   Our goal and hopefully your goal is to manage your diabetes in such a way to slow the progression of the disease and do all we can to keep you healthy  Home Care:  Eat healthy, exercise regularly, limit alcohol, and don't smoke! Check your blood sugar (glucose) once a day before breakfast, or as indicated by our discussion today.  The goal is to keep your morning fasting sugar less than 130.  In general you  should try to keep your blood sugars between 80 - 130 fasting or before meals. We also want to avoid hypoglycemia or low blood sugars less than 70.  Make sure you understand how to treat hypoglycemic episodes.  Ask me about this if we had not discussed this. If you are on medication, take your medications daily, don't run out of medications. Learn about low blood sugar (hypoglycemia). Know how to treat it. Wear a necklace or bracelet that says you have diabetes in the event of emergency for first responders to identify you have diabetes. Check your feet every night for cuts, sores, blisters, and redness. Tell your medical provider if you have problems. Maintain a normal body weight, or normal BMI - height to weight ratio of 20-25.  Ask me about this.  GET HELP RIGHT AWAY IF: You have trouble keeping your blood sugar in target range. You have problems with your medicines. You are sick and not getting better after 24 hours. You have a sore or wound that is not healing. You have vision problems or changes. You have a fever.   Exercise regularly since it has beneficial effects on the heart and blood sugars. Exercising at least three times per week or 150 minutes per week can be as important as medication to a diabetic.  Find some form of exercise that you will enjoy doing regularly.  This can include walking, biking, kayaking, golfing, swimming, dance, aerobics, hiking, etc.  If you have joint  problems, many local gyms have equipment to accommodate people with specific needs.    Vaccinations:  Diabetics are at increased risk for infection, and illnesses can take longer to resolve.  Current vaccine recommendations include yearly Influenza (flu) vaccine (recommended in October), Pneumococcal vaccine, Hepatitis B vaccine series, Tdap (tetanus, diptheria and pertussis) vaccine every 10 years, Covid vaccination, and other age appropriate vaccinations.     Office visits:  We recommend routine medical care  to make sure we are addressing prevention and issues as they arise.  Typically this could mean twice yearly or up to quarterly depending upon your unique health situation.  Exams should include a yearly physical, a yearly foot exam, and other examination as appropriate.  You should see an eye doctor yearly to help screen for and prevent blood vessel complications in your eyes.  Labs: Diabetics should have blood work done at least twice yearly to monitor your Hemoglobin A1C (a three-month average of your blood sugars) and your cholesterol.  You should have your urine and blood checked yearly to screen for kidney damage.  This may include creatinine and micro-albumin levels.  Other labs as appropriate.    Blood pressure goals:  Goal blood pressure in diabetics should be 130/80 or less. Monitoring your blood pressure with a home blood pressure cuff of your own is an excellent idea.  If you are prescribed medication for blood pressure, take your medication every day, and don't run out of medication.  Having high blood pressure can damage your heart, eyes, kidneys, and put you at risk for heart attack and stroke.  Tobacco use:  If you smoke, dip or chew, quitting will reduce your risk of heart attack, stroke, peripheral vascular disease, and many cancers.  Other Specialists:   Eye doctor: See your eye doctor yearly for routine eye exam and diabetic eye exam.  Make sure your eye doctor sends [place] a copy of the report  Dentist: See your dentist twice yearly for dental hygiene visits and routine dental care.  Brush and floss your teeth every day.  Dental hygiene is and important part of diabetic care.  Diabetics sometimes see other specialist depending on the complexity of your health issues.

## 2023-04-30 ENCOUNTER — Ambulatory Visit: Payer: BC Managed Care – PPO | Admitting: Medical

## 2023-04-30 ENCOUNTER — Encounter: Payer: Self-pay | Admitting: Medical

## 2023-04-30 VITALS — BP 120/70 | HR 71 | Wt 270.2 lb

## 2023-04-30 DIAGNOSIS — E1165 Type 2 diabetes mellitus with hyperglycemia: Secondary | ICD-10-CM | POA: Diagnosis not present

## 2023-04-30 DIAGNOSIS — E785 Hyperlipidemia, unspecified: Secondary | ICD-10-CM | POA: Diagnosis not present

## 2023-04-30 DIAGNOSIS — I10 Essential (primary) hypertension: Secondary | ICD-10-CM

## 2023-04-30 MED ORDER — MOUNJARO 2.5 MG/0.5ML ~~LOC~~ SOAJ: 2.5000 mg | SUBCUTANEOUS | 0 refills | Status: DC

## 2023-04-30 MED ORDER — MOUNJARO 5 MG/0.5ML ~~LOC~~ SOAJ: 5.0000 mg | SUBCUTANEOUS | 0 refills | Status: DC

## 2023-04-30 NOTE — Progress Notes (Signed)
Subjective:  Brett Gonzalez is a 32 y.o. male who presents for Chief Complaint  Patient presents with   1 month follow-up    1 month follow-up on Diabetes, declines flu and covid. No eye exam done     Here for diabetes follow up.  Came in 03/26/23 for new onset diabetes.   Since last visit 03/29/23 been working to clean up diet, not eat unhealthy things.   Eating more proteins and vegetables, avoiding lots of bread and pasta. Compliant with glipizide 5mg  BID   cut out soft drinks.   Occasionally will do diet sun drop.  Ended up stopping metformin a few weeks ago due to some literature he read.  Average sugars 112 in last few weeks  He was taking his blood pressure pill but stopped this 2 weeks ago as his blood pressures look good  He is not taking statin, concerned about potential side effects.  No other aggravating or relieving factors.    No other c/o.  Past Medical History:  Diagnosis Date   ADD (attention deficit disorder)    Asthma    worse in childhood   Diverticulitis 2020   GERD (gastroesophageal reflux disease)    Hypertension    Wears contact lenses    Current Outpatient Medications on File Prior to Visit  Medication Sig Dispense Refill   citalopram (CELEXA) 20 MG tablet TAKE ONE TABLET BY MOUTH ONCE DAILY. 90 tablet 0   glipiZIDE (GLUCOTROL) 5 MG tablet Take 1 tablet (5 mg total) by mouth 2 (two) times daily before a meal. 60 tablet 2   omeprazole (PRILOSEC) 20 MG capsule TAKE (1) CAPSULE BY MOUTH ONCE DAILY. 90 capsule 0   metFORMIN (GLUCOPHAGE) 500 MG tablet Take 1 tablet (500 mg total) by mouth 2 (two) times daily with a meal. (Patient not taking: Reported on 04/30/2023) 60 tablet 2   rosuvastatin (CRESTOR) 10 MG tablet Take 1 tablet (10 mg total) by mouth daily. 90 tablet 3   valsartan-hydrochlorothiazide (DIOVAN-HCT) 80-12.5 MG tablet Take 1 tablet by mouth daily. (Patient not taking: Reported on 04/30/2023) 90 tablet 3   No current facility-administered  medications on file prior to visit.    The following portions of the patient's history were reviewed and updated as appropriate: allergies, current medications, past family history, past medical history, past social history, past surgical history and problem list.  ROS Otherwise as in subjective above    Objective: BP 120/70   Pulse 71   Wt 270 lb 3.2 oz (122.6 kg)   BMI 37.69 kg/m   Wt Readings from Last 3 Encounters:  04/30/23 270 lb 3.2 oz (122.6 kg)  03/29/23 271 lb 6.4 oz (123.1 kg)  03/26/23 271 lb (122.9 kg)   BP Readings from Last 3 Encounters:  04/30/23 120/70  03/29/23 120/82  03/26/23 120/68   General appearance: alert, no distress, well developed, well nourished    Assessment: Encounter Diagnoses  Name Primary?   Type 2 diabetes mellitus with hyperglycemia, without long-term current use of insulin (HCC)    Essential hypertension, benign Yes   Hyperlipidemia, unspecified hyperlipidemia type      Plan: Diabetes-doing much better, monitoring home sugars, blood sugars averaging less than 130.  He stopped metformin on his own a few weeks ago.  Begin Mounjaro 2.5 mg weekly for the first month then increase to 5 mg if covered by insurance.  If not covered by insurance then continue glipizide 5 mg twice daily.  If Greggory Keen is covered  and he is able to start this he would discontinue glipizide  We discussed that he needs to contact us or communicate with [place] before stopping or discontinuing a medication.  I am concerned about some of this compliance today as he recently on his own discontinue metformin blood pressure pill and cholesterol pill  Hypertension-he currently is not agreeable to medication.  He is going to monitor his blood pressures.  We discussed a goal blood pressure and likely that his blood pressure will increase back again  Hyperlipidemia-we discussed standard of care for diabetes.  He is not agreeable to statin at this time  I am glad to hear he has  made some significant changes with diet and seeing much better blood sugar numbers.  Shirley was seen today for 1 month follow-up.  Diagnoses and all orders for this visit:  Essential hypertension, benign  Type 2 diabetes mellitus with hyperglycemia, without long-term current use of insulin (HCC) -     Urine Albumin/Creatinine with ratio (send out) [LAB689]  Hyperlipidemia, unspecified hyperlipidemia type  Other orders -     tirzepatide [facility]) 2.5 MG/0.5ML Pen; Inject 2.5 mg into the skin once a week. -     tirzepatide [facility]) 5 MG/0.5ML Pen; Inject 5 mg into the skin once a week.  Spent > 30 minutes face to face with patient in discussion of symptoms, evaluation, plan and recommendations.    Follow up: 2 -3 months

## 2023-04-30 NOTE — Patient Instructions (Addendum)
Check insurance for coverage for Mounjaro or Ozempic or Rybelsus or Trulicity

## 2023-05-01 LAB — MICROALBUMIN / CREATININE URINE RATIO
Creatinine, Urine: 141.9 mg/dL
Microalb/Creat Ratio: 3 mg/g{creat} (ref 0–29)
Microalbumin, Urine: 3.6 ug/mL

## 2023-05-02 NOTE — Progress Notes (Signed)
Results sent through MyChart

## 2023-06-01 ENCOUNTER — Telehealth: Payer: Self-pay | Admitting: Medical

## 2023-06-01 MED ORDER — OMEPRAZOLE 20 MG PO CPDR: DELAYED_RELEASE_CAPSULE | ORAL | 1 refills | Status: DC

## 2023-06-01 NOTE — Telephone Encounter (Signed)
done

## 2023-06-01 NOTE — Telephone Encounter (Signed)
Fax refill request for Omeprazole #90 Last filled 12/13/22

## 2023-06-21 ENCOUNTER — Other Ambulatory Visit: Payer: Self-pay | Admitting: Medical

## 2023-07-11 ENCOUNTER — Ambulatory Visit: Payer: BC Managed Care – PPO | Admitting: Medical

## 2023-08-17 ENCOUNTER — Telehealth: Payer: Self-pay | Admitting: *Deleted

## 2023-08-17 NOTE — Telephone Encounter (Signed)
 Patient was identified as falling into the [place] Measure - Diabetes.   Patient was: Left voicemail to schedule with primary care provider.

## 2023-09-03 ENCOUNTER — Other Ambulatory Visit: Payer: Self-pay | Admitting: Internal Medicine

## 2023-09-03 ENCOUNTER — Other Ambulatory Visit: Payer: Self-pay | Admitting: Medical

## 2023-09-03 DIAGNOSIS — F321 Major depressive disorder, single episode, moderate: Secondary | ICD-10-CM

## 2023-09-03 MED ORDER — VALSARTAN-HYDROCHLOROTHIAZIDE 80-12.5 MG PO TABS: 1.0000 | ORAL_TABLET | Freq: Every day | ORAL | 1 refills | Status: DC

## 2023-09-03 MED ORDER — GLIPIZIDE 5 MG PO TABS: 5.0000 mg | ORAL_TABLET | Freq: Two times a day (BID) | ORAL | 0 refills | Status: DC

## 2023-09-03 MED ORDER — ROSUVASTATIN CALCIUM 10 MG PO TABS: 10.0000 mg | ORAL_TABLET | Freq: Every day | ORAL | 1 refills | Status: DC

## 2023-09-03 MED ORDER — CITALOPRAM HYDROBROMIDE 20 MG PO TABS: 20.0000 mg | ORAL_TABLET | Freq: Every day | ORAL | 0 refills | Status: DC

## 2024-03-27 NOTE — Progress Notes (Signed)
 Brett Gonzalez                                          MRN: [id number]   03/27/2024   The VBCI Quality Team Specialist reviewed this patient medical record for the purposes of chart review for care gap closure. The following were reviewed: chart review for care gap closure-glycemic status assessment.    VBCI Quality Team

## 2024-04-03 ENCOUNTER — Telehealth: Payer: Self-pay

## 2024-04-03 NOTE — Telephone Encounter (Signed)
 Called Patient and Left Voicemail to call in to schedule Physical.

## 2024-04-09 ENCOUNTER — Telehealth: Payer: Self-pay

## 2024-04-09 NOTE — Telephone Encounter (Signed)
 Called Patient to schedule Physical that is over due, LVM for patient to call back.

## 2024-04-17 ENCOUNTER — Ambulatory Visit: Admitting: Medical

## 2024-04-17 VITALS — BP 124/80 | HR 86 | Wt 287.2 lb

## 2024-04-17 DIAGNOSIS — E1165 Type 2 diabetes mellitus with hyperglycemia: Secondary | ICD-10-CM | POA: Diagnosis not present

## 2024-04-17 DIAGNOSIS — E785 Hyperlipidemia, unspecified: Secondary | ICD-10-CM

## 2024-04-17 DIAGNOSIS — I10 Essential (primary) hypertension: Secondary | ICD-10-CM

## 2024-04-17 DIAGNOSIS — F321 Major depressive disorder, single episode, moderate: Secondary | ICD-10-CM

## 2024-04-17 DIAGNOSIS — R4586 Emotional lability: Secondary | ICD-10-CM

## 2024-04-17 DIAGNOSIS — Z6841 Body Mass Index (BMI) 40.0 and over, adult: Secondary | ICD-10-CM

## 2024-04-17 DIAGNOSIS — Z7985 Long-term (current) use of injectable non-insulin antidiabetic drugs: Secondary | ICD-10-CM

## 2024-04-17 LAB — POCT GLYCOSYLATED HEMOGLOBIN (HGB A1C): Hemoglobin A1C: 5.6 % (ref 4.0–5.6)

## 2024-04-17 MED ORDER — CITALOPRAM HYDROBROMIDE 20 MG PO TABS: 20.0000 mg | ORAL_TABLET | Freq: Every day | ORAL | 0 refills | Status: AC

## 2024-04-17 MED ORDER — GLIPIZIDE 5 MG PO TABS: 5.0000 mg | ORAL_TABLET | Freq: Two times a day (BID) | ORAL | 0 refills | Status: AC

## 2024-04-17 MED ORDER — TIRZEPATIDE 2.5 MG/0.5ML ~~LOC~~ SOAJ: 2.5000 mg | SUBCUTANEOUS | 0 refills | Status: AC

## 2024-04-17 MED ORDER — VALSARTAN-HYDROCHLOROTHIAZIDE 80-12.5 MG PO TABS: 1.0000 | ORAL_TABLET | Freq: Every day | ORAL | 1 refills | Status: AC

## 2024-04-17 MED ORDER — ROSUVASTATIN CALCIUM 10 MG PO TABS: 10.0000 mg | ORAL_TABLET | Freq: Every day | ORAL | 1 refills | Status: AC

## 2024-04-17 NOTE — Progress Notes (Signed)
 Subjective: Chief Complaint  Patient presents with   Medical Management of Chronic Issues    Med check. Declines flu and covid shots. BS have been going well 110-130    History of Present Illness Brett Gonzalez is a 33 year old male with diabetes, hypertension, and hyperlipidemia who presents for a medication check.  Here with his wife today.  He is currently taking valsartan  HCT 80/12.5 mg for blood pressure, citalopram  20 mg, and Crestor  10 mg daily.   Last year he had a new diagnosis of diabetes with blood sugars over 600 requiring hospitalization.  His last visit here was November 2024 posthospitalization.  In the last year he has been very aggressive with dietary changes and lifestyle changes.  He does check his blood sugars regularly.  He only uses glipizide  occasionally if he is going to eat something heavier.  Otherwise he controls his sugars with diet.  He was previously prescribed Mounjaro , but never started it due to insurance issues and a change in pharmacy.  He recalls a hospitalization a year ago when his blood sugar was around 600 mg/dL. Since then, he has made dietary changes, such as reducing portion sizes and switching to zero-sugar sodas at home, although he occasionally consumes regular soda or sweet tea when dining out. He reports recent blood sugar readings between 110 to 130 mg/dL, and the highest being 230 mg/dL, which occurred only four times in the past year. His most recent A1c was 5.6%.  He works as a set designer, which involves walking and lifting, contributing to his physical activity. He checks his blood sugar daily and reports a recent reading of 88 mg/dL after a meal without taking glipizide .  Regarding his mental health, he is on citalopram  20 mg and feels good 85% of the time, but occasionally experiences low moods. His partner notes a change in his mood and sometimes reminds him to take his medication. He has not engaged in counseling and works  full-time. He has two children, aged 2 and 53.  No regular home blood pressure monitoring is reported.   Past Medical History:  Diagnosis Date   ADD (attention deficit disorder)    Asthma    worse in childhood   Diverticulitis 2020   GERD (gastroesophageal reflux disease)    Hypertension    Wears contact lenses    Current Outpatient Medications on File Prior to Visit  Medication Sig Dispense Refill   omeprazole  (PRILOSEC ) 20 MG capsule TAKE (1) CAPSULE BY MOUTH ONCE DAILY. 90 capsule 1   No current facility-administered medications on file prior to visit.    ROS as in subjective   Objective: BP 124/80   Pulse 86   Wt 287 lb 3.2 oz (130.3 kg)   BMI 40.06 kg/m   Gen: wd, wn, nad No extremity edema Psych: Pleasant, answers questions appropriate    Assessment and Plan Encounter Diagnoses  Name Primary?   Essential hypertension, benign Yes   Type 2 diabetes mellitus with hyperglycemia, without long-term current use of insulin  (HCC)    Mood change    BMI 40.0-44.9, adult (HCC)    Hyperlipidemia, unspecified hyperlipidemia type    Depression, major, single episode, moderate (HCC)     New diagnosis type 2 diabetes mellitus last year but hemoglobin A1c today 5.6% A1c at 5.6, blood glucose 110-130 mg/dL with home readings. Lifestyle changes improved glycemic control. Interested in Mounjaro  for weight management. - Prescribed Mounjaro , sent to pharmacy. - Continue glipizide  for high-carb meals. -  Encouraged portion control, reduced sugary drinks. - Discussed insurance issues for Mounjaro , alternatives if not covered.  Obesity BMI over 30, risk for colon cancer and joint pain. Weight gain despite glycemic control. Interested in Mounjaro  for weight loss. - Prescribed Mounjaro  for weight management. - Encouraged reducing bread, pasta; increase fruits, vegetables. - Discussed Mounjaro  side effects, gradual dose escalation.  Depression Managed with citalopram  20 mg  daily. Feels good 85% of the time, occasional sadness, social withdrawal. No counseling. - Continue citalopram  20 mg daily. - Encouraged counseling for long-term management. - Provided list of local counselors, discussed employee assistance programs. - Consider Wellbutrin or Prozac if symptoms worsen.  Hypertension -Continue valsartan  HCT 80/12.5 mg daily  Hyperlipidemia -Continue Crestor  10 mg daily   Brett Gonzalez was seen today for medical management of chronic issues.  Diagnoses and all orders for this visit:  Essential hypertension, benign  Type 2 diabetes mellitus with hyperglycemia, without long-term current use of insulin  (HCC) -     POCT glycosylated hemoglobin (Hb A1C)  Mood change  BMI 40.0-44.9, adult (HCC)  Hyperlipidemia, unspecified hyperlipidemia type  Depression, major, single episode, moderate (HCC) Comments: error Orders: -     citalopram  (CELEXA ) 20 MG tablet; Take 1 tablet (20 mg total) by mouth daily.  Other orders -     tirzepatide  (MOUNJARO ) 2.5 MG/0.5ML Pen; Inject 2.5 mg into the skin once a week. -     glipiZIDE  (GLUCOTROL ) 5 MG tablet; Take 1 tablet (5 mg total) by mouth 2 (two) times daily before a meal. -     rosuvastatin  (CRESTOR ) 10 MG tablet; Take 1 tablet (10 mg total) by mouth daily. -     valsartan -hydrochlorothiazide  (DIOVAN -HCT) 80-12.5 MG tablet; Take 1 tablet by mouth daily.    F/u next month for physical as planned

## 2024-04-17 NOTE — Patient Instructions (Signed)
Counseling Services  [facility]al [phone] [address] [place], [place] [address]   [facility], counseling [address] [place], [place] [address] [phone]   [facility] [phone] [address],  [place], [place] [address]   Dr. Len Blalock Child and teen psychiatrist [address], [place], [place] [address] [phone]   Dr. Nicole Cella, [place] [phone] [place], [place] [address]   [facility] [address] [place], [place] [address] [phone]    [facility] Crisis Line and Main phone number [phone]  [facility]  [phone]  [facility] [phone]  [facility] Center [phone]   [facility] [address] [place], [place], [place] [address] [phone]  [facility],  [address],  [place], [place] [address] [phone]    [facility] [phone] [address] [place], [place], [place] [address]   [facility] [phone] office [address] [place], [place] [address] Crisis services, Family support, in home therapy, treatment for Anxiety, PTSD, Sexual Assault, Substance Abuse, Financial/Credit Counseling, Variety of other services     Other Resources  If you are experiencing a mental health crisis or an emergency, please call 911 or go to the nearest emergency department.  [facility]   [phone] [facility]  [phone] [facility]   [phone]  Suicide Hotline 1-800-Suicide (630)410-8066)  National Suicide Prevention Lifeline 623-658-6650  (508)507-8219)  Domestic Violence, Rape/Crisis - [facility] [place] 892-119-4174  [employer] Violence Hotline 1-800-799-SAFE 618-116-3114)  To report Child or Elder Abuse, please call: [facility] Police Department  [phone] [facility] Department  [phone]  Teen Crisis line [phone] or [phone]

## 2024-05-07 ENCOUNTER — Other Ambulatory Visit: Payer: Self-pay | Admitting: Medical

## 2024-05-07 MED ORDER — OMEPRAZOLE 20 MG PO CPDR: DELAYED_RELEASE_CAPSULE | ORAL | 1 refills | Status: AC

## 2024-05-07 MED ORDER — MOUNJARO 5 MG/0.5ML ~~LOC~~ SOAJ: 5.0000 mg | SUBCUTANEOUS | 0 refills | Status: AC

## 2024-06-03 ENCOUNTER — Encounter: Admitting: Medical

## 2024-09-24 ENCOUNTER — Encounter: Admitting: Medical
